# Patient Record
Sex: Male | Born: 1960
Health system: Southern US, Community
[De-identification: ages and names within clinical notes are randomized; demographics above are authoritative.]

## PROBLEM LIST (undated history)

## (undated) DIAGNOSIS — F419 Anxiety disorder, unspecified: Secondary | ICD-10-CM

## (undated) DIAGNOSIS — I714 Abdominal aortic aneurysm, without rupture, unspecified: Secondary | ICD-10-CM

## (undated) DIAGNOSIS — K635 Polyp of colon: Secondary | ICD-10-CM

## (undated) DIAGNOSIS — I2510 Atherosclerotic heart disease of native coronary artery without angina pectoris: Secondary | ICD-10-CM

## (undated) DIAGNOSIS — I10 Essential (primary) hypertension: Secondary | ICD-10-CM

## (undated) DIAGNOSIS — E785 Hyperlipidemia, unspecified: Secondary | ICD-10-CM

## (undated) DIAGNOSIS — L309 Dermatitis, unspecified: Secondary | ICD-10-CM

## (undated) DIAGNOSIS — J449 Chronic obstructive pulmonary disease, unspecified: Secondary | ICD-10-CM

## (undated) DIAGNOSIS — N529 Male erectile dysfunction, unspecified: Secondary | ICD-10-CM

## (undated) DIAGNOSIS — R911 Solitary pulmonary nodule: Secondary | ICD-10-CM

## (undated) HISTORY — DX: Essential (primary) hypertension: I10

## (undated) HISTORY — DX: Solitary pulmonary nodule: R91.1

## (undated) HISTORY — DX: Atherosclerotic heart disease of native coronary artery without angina pectoris: I25.10

## (undated) HISTORY — DX: Anxiety disorder, unspecified: F41.9

## (undated) HISTORY — PX: VASECTOMY: SHX75

## (undated) HISTORY — DX: Dermatitis, unspecified: L30.9

## (undated) HISTORY — DX: Hyperlipidemia, unspecified: E78.5

## (undated) HISTORY — DX: Abdominal aortic aneurysm, without rupture, unspecified: I71.40

## (undated) HISTORY — DX: Abdominal aortic aneurysm, without rupture: I71.4

## (undated) HISTORY — DX: Chronic obstructive pulmonary disease, unspecified: J44.9

## (undated) HISTORY — PX: TONSILLECTOMY AND ADENOIDECTOMY: SUR1326

## (undated) HISTORY — DX: Male erectile dysfunction, unspecified: N52.9

---
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
## 2006-06-16 ENCOUNTER — Ambulatory Visit: Payer: Self-pay | Admitting: Family Medicine

## 2007-06-02 ENCOUNTER — Ambulatory Visit: Payer: Self-pay | Admitting: Family Medicine

## 2007-06-02 DIAGNOSIS — I10 Essential (primary) hypertension: Secondary | ICD-10-CM | POA: Insufficient documentation

## 2007-06-02 DIAGNOSIS — F411 Generalized anxiety disorder: Secondary | ICD-10-CM | POA: Insufficient documentation

## 2007-06-08 ENCOUNTER — Encounter: Payer: Self-pay | Admitting: Family Medicine

## 2007-07-14 ENCOUNTER — Ambulatory Visit: Payer: Self-pay | Admitting: Family Medicine

## 2007-07-14 DIAGNOSIS — B351 Tinea unguium: Secondary | ICD-10-CM | POA: Insufficient documentation

## 2007-07-14 DIAGNOSIS — L600 Ingrowing nail: Secondary | ICD-10-CM | POA: Insufficient documentation

## 2007-08-22 ENCOUNTER — Ambulatory Visit: Payer: Self-pay | Admitting: Family Medicine

## 2007-08-22 LAB — CONVERTED CEMR LAB
ALT: 39 units/L (ref 0–53)
AST: 25 units/L (ref 0–37)

## 2007-10-14 ENCOUNTER — Ambulatory Visit: Payer: Self-pay | Admitting: Family Medicine

## 2007-10-14 LAB — CONVERTED CEMR LAB
ALT: 39 units/L (ref 0–53)
AST: 26 units/L (ref 0–37)

## 2007-10-18 ENCOUNTER — Ambulatory Visit: Payer: Self-pay | Admitting: Family Medicine

## 2008-01-19 ENCOUNTER — Ambulatory Visit: Payer: Self-pay | Admitting: Family Medicine

## 2008-07-31 ENCOUNTER — Ambulatory Visit: Payer: Self-pay | Admitting: Family Medicine

## 2008-07-31 LAB — CONVERTED CEMR LAB
ALT: 30 units/L (ref 0–53)
BUN: 13 mg/dL (ref 6–23)
CO2: 32 meq/L (ref 19–32)
Chloride: 105 meq/L (ref 96–112)
Cholesterol: 179 mg/dL (ref 0–200)
Creatinine, Ser: 0.9 mg/dL (ref 0.4–1.5)
Eosinophils Absolute: 0.2 10*3/uL (ref 0.0–0.7)
Eosinophils Relative: 1.9 % (ref 0.0–5.0)
Glucose, Bld: 90 mg/dL (ref 70–99)
HCT: 42.7 % (ref 39.0–52.0)
LDL Cholesterol: 110 mg/dL — ABNORMAL HIGH (ref 0–99)
Lymphs Abs: 3.1 10*3/uL (ref 0.7–4.0)
MCHC: 35.4 g/dL (ref 30.0–36.0)
MCV: 87.6 fL (ref 78.0–100.0)
Monocytes Absolute: 0.5 10*3/uL (ref 0.1–1.0)
PSA: 0.99 ng/mL (ref 0.10–4.00)
Platelets: 230 10*3/uL (ref 150.0–400.0)
Potassium: 5 meq/L (ref 3.5–5.1)
TSH: 0.94 microintl units/mL (ref 0.35–5.50)
Total Bilirubin: 1 mg/dL (ref 0.3–1.2)
Total Protein: 7.2 g/dL (ref 6.0–8.3)
Triglycerides: 163 mg/dL — ABNORMAL HIGH (ref 0.0–149.0)
WBC: 8.6 10*3/uL (ref 4.5–10.5)

## 2008-08-06 ENCOUNTER — Ambulatory Visit: Payer: Self-pay | Admitting: Family Medicine

## 2008-08-06 DIAGNOSIS — G4726 Circadian rhythm sleep disorder, shift work type: Secondary | ICD-10-CM | POA: Insufficient documentation

## 2008-08-06 DIAGNOSIS — L259 Unspecified contact dermatitis, unspecified cause: Secondary | ICD-10-CM | POA: Insufficient documentation

## 2008-09-18 ENCOUNTER — Ambulatory Visit: Payer: Self-pay | Admitting: Family Medicine

## 2009-01-09 DIAGNOSIS — G4700 Insomnia, unspecified: Secondary | ICD-10-CM | POA: Insufficient documentation

## 2009-11-12 ENCOUNTER — Encounter (INDEPENDENT_AMBULATORY_CARE_PROVIDER_SITE_OTHER): Payer: Self-pay | Admitting: *Deleted

## 2010-05-07 NOTE — Letter (Signed)
Summary: Nadara Eaton letter  [place] [facility]  [address] [place], [place] [address]   Phone: [phone]  Fax: [phone]       11/12/2009 MRN: [id number]  Paul Koch [address] [place], [place]  [address]  Dear Mr. HANSELL,  [facility] [place], and [facility] announce the retirement of Arta Silence, M.D., from full-time practice at the [facility] office effective October 03, 2009 and his plans of returning part-time.  It is important to Dr. Hetty Ely and to our practice that you understand that [facility] has seven physicians in our office for your health care needs.  We will continue to offer the same exceptional care that you have today.    Dr. Hetty Ely has spoken to many of you about his plans for retirement and returning part-time in the fall.   We will continue to work with you through the transition to schedule appointments for you in the office and meet the high standards that [place] is committed to.   Again, it is with great pleasure that we share the news that Dr. Hetty Ely will return to [facility] at [facility] in October of 2011 with a reduced schedule.    If you have any questions, or would like to request an appointment with one of our physicians, please call us at [phone] and press the option for Scheduling an appointment.  We take pleasure in providing you with excellent patient care and look forward to seeing you at your next office visit.  Our [facility] Physicians are:  Tillman Abide, M.D. Laurita Quint, M.D. Roxy Manns, M.D. Kerby Nora, M.D. Hannah Beat, M.D. Ruthe Mannan, M.D. We proudly welcomed Raechel Ache, M.D. and Eustaquio Boyden, M.D. to the practice in July/August 2011.  Sincerely,  [facility]

## 2012-04-06 DIAGNOSIS — K635 Polyp of colon: Secondary | ICD-10-CM

## 2012-04-06 HISTORY — DX: Polyp of colon: K63.5

## 2013-02-23 LAB — HM COLONOSCOPY

## 2016-01-14 ENCOUNTER — Other Ambulatory Visit: Payer: Self-pay | Admitting: Family Medicine

## 2016-01-14 DIAGNOSIS — R079 Chest pain, unspecified: Secondary | ICD-10-CM

## 2016-01-21 ENCOUNTER — Telehealth (HOSPITAL_COMMUNITY): Payer: Self-pay

## 2016-01-21 NOTE — Telephone Encounter (Signed)
Encounter complete. 

## 2016-01-22 ENCOUNTER — Telehealth (HOSPITAL_COMMUNITY): Payer: Self-pay

## 2016-01-22 NOTE — Telephone Encounter (Signed)
Encounter complete. 

## 2016-01-23 ENCOUNTER — Encounter (HOSPITAL_COMMUNITY): Payer: Self-pay | Admitting: *Deleted

## 2016-01-23 ENCOUNTER — Ambulatory Visit (HOSPITAL_COMMUNITY)
Admission: RE | Admit: 2016-01-23 | Discharge: 2016-01-23 | Disposition: A | Discharge status: Home / Self Care | Payer: BLUE CROSS/BLUE SHIELD | Source: Ambulatory Visit | Attending: Cardiovascular Disease | Admitting: Cardiovascular Disease

## 2016-01-23 DIAGNOSIS — R0609 Other forms of dyspnea: Secondary | ICD-10-CM | POA: Diagnosis not present

## 2016-01-23 DIAGNOSIS — R079 Chest pain, unspecified: Secondary | ICD-10-CM | POA: Diagnosis not present

## 2016-01-23 DIAGNOSIS — Z87891 Personal history of nicotine dependence: Secondary | ICD-10-CM | POA: Diagnosis not present

## 2016-01-23 DIAGNOSIS — R9439 Abnormal result of other cardiovascular function study: Secondary | ICD-10-CM | POA: Insufficient documentation

## 2016-01-23 DIAGNOSIS — Z8249 Family history of ischemic heart disease and other diseases of the circulatory system: Secondary | ICD-10-CM | POA: Insufficient documentation

## 2016-01-23 DIAGNOSIS — I10 Essential (primary) hypertension: Secondary | ICD-10-CM | POA: Diagnosis not present

## 2016-01-23 LAB — MYOCARDIAL PERFUSION IMAGING
CHL CUP NUCLEAR SDS: 8
CHL CUP NUCLEAR SRS: 6
CHL CUP NUCLEAR SSS: 14
CSEPEW: 9.6 METS
CSEPPHR: 139 {beats}/min
Exercise duration (min): 8 min
Exercise duration (sec): 0 s
LVDIAVOL: 74 mL (ref 62–150)
LVSYSVOL: 24 mL
MPHR: 163 {beats}/min
NUC STRESS TID: 0.74
Percent HR: 85 %
RPE: 17
Rest HR: 51 {beats}/min

## 2016-01-23 MED ORDER — TECHNETIUM TC 99M TETROFOSMIN IV KIT
30.0000 | PACK | Freq: Once | INTRAVENOUS | Status: AC | PRN
  Administered 2016-01-23: 30.8 via INTRAVENOUS
  Filled 2016-01-23: qty 30

## 2016-01-23 MED ORDER — TECHNETIUM TC 99M TETROFOSMIN IV KIT
10.2000 | PACK | Freq: Once | INTRAVENOUS | Status: AC | PRN
  Administered 2016-01-23: 10.2 via INTRAVENOUS
  Filled 2016-01-23: qty 11

## 2016-01-23 NOTE — Progress Notes (Signed)
Dr Tresa EndoKelly reviewed Myoview study and EKG's. Per Dr Tresa EndoKelly, pt instructed to take metoprolol when he gets home. Pt also instructed to start taking one baby aspirin daily. Pt verbalized understanding. Ok to d/c pt home.

## 2016-01-28 ENCOUNTER — Ambulatory Visit
Admission: RE | Admit: 2016-01-28 | Discharge: 2016-01-28 | Disposition: A | Discharge status: Home / Self Care | Payer: BLUE CROSS/BLUE SHIELD | Source: Ambulatory Visit | Attending: Internal Medicine | Admitting: Internal Medicine

## 2016-01-28 ENCOUNTER — Encounter (INDEPENDENT_AMBULATORY_CARE_PROVIDER_SITE_OTHER): Payer: Self-pay

## 2016-01-28 ENCOUNTER — Encounter: Payer: Self-pay | Admitting: Internal Medicine

## 2016-01-28 ENCOUNTER — Other Ambulatory Visit
Admission: RE | Admit: 2016-01-28 | Discharge: 2016-01-28 | Disposition: A | Discharge status: Home / Self Care | Payer: BLUE CROSS/BLUE SHIELD | Source: Ambulatory Visit | Attending: Internal Medicine | Admitting: Internal Medicine

## 2016-01-28 ENCOUNTER — Ambulatory Visit (INDEPENDENT_AMBULATORY_CARE_PROVIDER_SITE_OTHER): Payer: BLUE CROSS/BLUE SHIELD | Admitting: Internal Medicine

## 2016-01-28 VITALS — BP 126/82 | HR 54 | Ht 69.0 in | Wt 165.8 lb

## 2016-01-28 DIAGNOSIS — I10 Essential (primary) hypertension: Secondary | ICD-10-CM

## 2016-01-28 DIAGNOSIS — Z87891 Personal history of nicotine dependence: Secondary | ICD-10-CM | POA: Diagnosis not present

## 2016-01-28 DIAGNOSIS — Z825 Family history of asthma and other chronic lower respiratory diseases: Secondary | ICD-10-CM | POA: Diagnosis not present

## 2016-01-28 DIAGNOSIS — Z01812 Encounter for preprocedural laboratory examination: Secondary | ICD-10-CM

## 2016-01-28 DIAGNOSIS — F419 Anxiety disorder, unspecified: Secondary | ICD-10-CM | POA: Diagnosis not present

## 2016-01-28 DIAGNOSIS — R918 Other nonspecific abnormal finding of lung field: Secondary | ICD-10-CM | POA: Insufficient documentation

## 2016-01-28 DIAGNOSIS — Z01818 Encounter for other preprocedural examination: Secondary | ICD-10-CM | POA: Insufficient documentation

## 2016-01-28 DIAGNOSIS — Z8249 Family history of ischemic heart disease and other diseases of the circulatory system: Secondary | ICD-10-CM | POA: Diagnosis not present

## 2016-01-28 DIAGNOSIS — R9439 Abnormal result of other cardiovascular function study: Secondary | ICD-10-CM | POA: Diagnosis not present

## 2016-01-28 DIAGNOSIS — Z823 Family history of stroke: Secondary | ICD-10-CM | POA: Diagnosis not present

## 2016-01-28 DIAGNOSIS — R079 Chest pain, unspecified: Secondary | ICD-10-CM

## 2016-01-28 DIAGNOSIS — Z7982 Long term (current) use of aspirin: Secondary | ICD-10-CM | POA: Diagnosis not present

## 2016-01-28 DIAGNOSIS — Z79899 Other long term (current) drug therapy: Secondary | ICD-10-CM | POA: Diagnosis not present

## 2016-01-28 LAB — CBC
HEMATOCRIT: 45.8 % (ref 40.0–52.0)
Hemoglobin: 15.8 g/dL (ref 13.0–18.0)
MCH: 29.5 pg (ref 26.0–34.0)
MCHC: 34.4 g/dL (ref 32.0–36.0)
MCV: 85.6 fL (ref 80.0–100.0)
PLATELETS: 204 10*3/uL (ref 150–440)
RBC: 5.35 MIL/uL (ref 4.40–5.90)
RDW: 13.6 % (ref 11.5–14.5)
WBC: 8.8 10*3/uL (ref 3.8–10.6)

## 2016-01-28 LAB — BASIC METABOLIC PANEL
ANION GAP: 7 (ref 5–15)
BUN: 18 mg/dL (ref 6–20)
CALCIUM: 9.2 mg/dL (ref 8.9–10.3)
CO2: 30 mmol/L (ref 22–32)
CREATININE: 1.13 mg/dL (ref 0.61–1.24)
Chloride: 100 mmol/L — ABNORMAL LOW (ref 101–111)
Glucose, Bld: 104 mg/dL — ABNORMAL HIGH (ref 65–99)
Potassium: 4.4 mmol/L (ref 3.5–5.1)
Sodium: 137 mmol/L (ref 135–145)

## 2016-01-28 LAB — PROTIME-INR
INR: 1.02
Prothrombin Time: 13.4 seconds (ref 11.4–15.2)

## 2016-01-28 MED ORDER — NITROGLYCERIN 0.4 MG SL SUBL: 0.4000 mg | SUBLINGUAL_TABLET | SUBLINGUAL | 3 refills | Status: DC | PRN

## 2016-01-28 MED ORDER — ISOSORBIDE MONONITRATE ER 30 MG PO TB24: 30.0000 mg | ORAL_TABLET | Freq: Every day | ORAL | 3 refills | Status: DC

## 2016-01-28 NOTE — Progress Notes (Signed)
New Outpatient Visit Date: 01/28/2016  Referring Provider: Gilles Chiquito, MD [facility]  Chief Complaint: Chest pain and abnormal stress test  HPI:  Mr. Grieco is a 55 y.o. year-old male with history of hypertension and anxiety, who has been referred by Dr. Ellin Goodie for evaluation of chest pain. The patient reports that he began noticing a squeezing/tightness sensation in his chest with mowing about 1 month ago. This typically resolves within a few minutes. However, it is now started occurring with even mild activity such as walking or climbing stairs. He is noted some associated numbness involving the left shoulder as well as exertional dyspnea. The patient's maximal intensity has been 6/10. He noted one episode that occurred at night while lying in bed that lasted about 5-10 minutes. At the time, his blood pressure was 211/120 and remained elevated into the next day. This prompted him to speak with his PCP, who adjusted his blood pressure medications and referred for myocardial perfusion stress test. The stress test revealed a partially reversible inferior/inferoseptal defect.  The patient has continued to have intermittent chest pain, most recently a few days ago [place]. He is concerned that a lot of stress at work may also be contributing to his discomfort. He has not undergone previous cardiovascular evaluation and denies a history of known cardiac disease with the exception of a right bundle branch block (not evident on today's EKG). He is currently on metoprolol, ramipril, and amlodipine, which he is tolerating well. He has not had any significant bleeding.  --------------------------------------------------------------------------------------------------  Cardiovascular History & Procedures: Cardiovascular Problems:  Progressive angina with abnormal stress test  Risk Factors:  Age, male gender, and  hypertension  Cath/PCI:  None  CV Surgery:  None  EP Procedures and Devices:  None  Non-Invasive Evaluation(s):  Exercise myocardial perfusion stress test (01/23/16): I have personally reviewed the images.  Moderate size, moderate in severity partially reversible basal inferoseptal, mid inferoseptal, mid inferior, and apical inferior defect consistent with ischemia and possible scar. Treadmill demonstrates 2 mm ST depression in the inferior leads. LVEF greater at 65%.  Recent CV Pertinent Labs: Lab Results  Component Value Date   CHOL 179 07/31/2008   HDL 36.10 (L) 07/31/2008   LDLCALC 110 (H) 07/31/2008   TRIG 163.0 (H) 07/31/2008   CHOLHDL 5 07/31/2008   K 5.0 07/31/2008   BUN 13 07/31/2008   CREATININE 0.9 07/31/2008    --------------------------------------------------------------------------------------------------  Past Medical History:  Diagnosis Date  . Anxiety disorder   . Eczema   . Hypertension     Past Surgical History:  Procedure Laterality Date  . TONSILLECTOMY AND ADENOIDECTOMY    . VASECTOMY      Outpatient Encounter Prescriptions as of 01/28/2016  Medication Sig  . amLODipine (NORVASC) 5 MG tablet Take 5 mg by mouth 2 (two) times daily.  Marland Kitchen aspirin 81 MG chewable tablet Chew 81 mg by mouth daily.  . clonazePAM (KLONOPIN) 1 MG tablet Take 1 mg by mouth 2 (two) times daily.  Marland Kitchen FLUoxetine (PROZAC) 20 MG capsule TAKE 1 CAPSULE BY MOUTH IN THE MORNING ONCE DAILY  . loratadine (CLARITIN) 10 MG tablet Take 10 mg by mouth daily.  . metoprolol (LOPRESSOR) 100 MG tablet Take 100 mg by mouth 2 (two) times daily.  . ramipril (ALTACE) 10 MG capsule Take 10 mg by mouth 2 (two) times daily.   Marland Kitchen zolpidem (AMBIEN) 10 MG tablet Take 10 mg by mouth at bedtime as  needed.  . isosorbide mononitrate (IMDUR) 30 MG 24 hr tablet Take 1 tablet (30 mg total) by mouth daily.  . nitroGLYCERIN (NITROSTAT) 0.4 MG SL tablet Place 1 tablet (0.4 mg total) under the tongue every  5 (five) minutes as needed for chest pain.   No facility-administered encounter medications on file as of 01/28/2016.     Allergies: Clindamycin hcl  Social History   Social History  . Marital status: Married    Spouse name: N/A  . Number of children: N/A  . Years of education: N/A   Occupational History  . Not on file.   Social History Main Topics  . Smoking status: Former Smoker    Packs/day: 1.00    Years: 20.00    Types: Cigarettes    Quit date: 2004  . Smokeless tobacco: Never Used  . Alcohol use No     Comment: social (5 drinks/year)  . Drug use: No  . Sexual activity: Not on file   Other Topics Concern  . Not on file   Social History Narrative  . No narrative on file    Family History  Problem Relation Age of Onset  . Brain cancer Mother   . Lung cancer Mother   . Arrhythmia Mother   . Hypertension Mother   . Hyperlipidemia Mother   . Bladder Cancer Mother   . Stroke Mother   . Emphysema Father     Review of Systems: A 12-system review of systems was performed and was negative except as noted in the HPI.  --------------------------------------------------------------------------------------------------  Physical Exam: BP 126/82 (BP Location: Right Arm, Patient Position: Sitting, Cuff Size: Normal)   Pulse (!) 54   Ht 5\' 9"  (1.753 m)   Wt 165 lb 12 oz (75.2 kg)   BMI 24.48 kg/m   General:  Well-developed, well-nourished man seated comfortably in the exam room. He is accompanied by his wife. HEENT: No conjunctival pallor or scleral icterus.  Moist mucous membranes.  OP clear. Neck: Supple without lymphadenopathy, thyromegaly, JVD, or HJR.  No carotid bruit. Lungs: Normal work of breathing.  Clear to auscultation bilaterally without wheezes or crackles. Heart: Regular rate and rhythm without murmurs, rubs, or gallops.  Non-displaced PMI. Abd: Bowel sounds present.  Soft, NT/ND without hepatosplenomegaly Ext: No lower extremity edema.  Radial and  posterior tibial pulses are 2+ bilaterally. Dorsalis pedis pulses are 1+ bilaterally. Skin: warm and dry without rash Neuro: CNIII-XII intact.  Strength and fine-touch sensation intact in upper and lower extremities bilaterally. Psych: Normal mood and affect.  EKG:  I have personally reviewed the tracing.  Normal sinus rhythm with leftward axis. No significant abnormalities.  Lab Results  Component Value Date   WBC 8.6 07/31/2008   HGB 15.1 07/31/2008   HCT 42.7 07/31/2008   MCV 87.6 07/31/2008   PLT 230.0 07/31/2008    Lab Results  Component Value Date   NA 143 07/31/2008   K 5.0 07/31/2008   CL 105 07/31/2008   CO2 32 07/31/2008   BUN 13 07/31/2008   CREATININE 0.9 07/31/2008   GLUCOSE 90 07/31/2008   ALT 30 07/31/2008    Lab Results  Component Value Date   CHOL 179 07/31/2008   HDL 36.10 (L) 07/31/2008   LDLCALC 110 (H) 07/31/2008   TRIG 163.0 (H) 07/31/2008   CHOLHDL 5 07/31/2008    --------------------------------------------------------------------------------------------------  ASSESSMENT AND PLAN: 1. Chest pain and abnormal stress test The patient's progressive angina over the last month in the setting of  an abnormal stress test are very concerning for significant coronary insufficiency. He is chest pain-free today. We have agreed to start isosorbide mononitrate 30 mg daily. I have also prescribed as needed sublingual nitroglycerin should his chest pain recur. I advised him to limit his activity pending cardiac catheterization. We will attempt to schedule him for left heart catheterization with possible PCI at [facility] tomorrow or Thursday. I have reviewed the risks, indications, and alternatives to cardiac catheterization, possible angioplasty, and stenting with the patient. Risks include but are not limited to bleeding, infection, vascular injury, stroke, myocardial infection, arrhythmia, kidney injury, radiation-related injury in the case of prolonged fluoroscopy  use, emergency cardiac surgery, and death. The patient understands the risks of serious complication is 1-2 in 1000 with diagnostic cardiac cath and 1-2% or less with angioplasty/stenting. I advised Mr. Dorris CarnesRorer to call [phone] if he develops chest pain at rest that does not resolve promptly with sublingual nitroglycerin. He will likely need addition of a high-intensity statin. We will defer this until his catheterization has been completed later this week.  2.  Hypertension: Blood pressure is reasonably well controlled today. Will not make any changes to his current medication with the exception of the aforementioned isosorbide mononitrate.  Follow-up: To be determined based on cardiac catheterization results.  Yvonne Kendallhristopher Azya Barbero, MD 01/28/2016 4:10 PM

## 2016-01-28 NOTE — Patient Instructions (Addendum)
Medication Instructions:  Your physician has recommended you make the following change in your medication:  START taking imdur 30mg  once daily START taking nitroglycerin as needed. You may take one every 5 minutes up to 3 times. If chest pain still unresolved, proceed to the ED   Labwork: BMET, CBC, PT/INR  Testing/Procedures: Your physician has requested that you have a cardiac catheterization. Cardiac catheterization is used to diagnose and/or treat various heart conditions. Doctors may recommend this procedure for a number of different reasons. The most common reason is to evaluate chest pain. Chest pain can be a symptom of coronary artery disease (CAD), and cardiac catheterization can show whether plaque is narrowing or blocking your heart's arteries. This procedure is also used to evaluate the valves, as well as measure the blood flow and oxygen levels in different parts of your heart. For further information please visit [email or web address] Please follow instruction sheet, as given.  [facility] Cardiac Cath Instructions   You are scheduled for a Cardiac Cath on: Thursday, October 26  Please arrive at 5:30am on the day of your procedure  Please expect a call from our [facility] to pre-register you  Do not eat/drink anything after midnight  Someone will need to drive you home  It is recommended someone be with you for the first 24 hours after your procedure  Wear clothes that are easy to get on/off and wear slip on shoes if possible   Medications bring a current list of all medications with you  _xx__ You may take all of your medications the morning of your procedure with enough water to swallow safely     Day of your procedure: [facility], Short Stay, Entrance A [address] [place] [phone]  The usual length of stay after your procedure is about 2 to 3 hours.  This can vary.  Pack an overnight bag in case you stay overnight.     If you have any questions, please call our office at [phone].   Follow-Up: Your physician recommends that you schedule a follow-up appointment after your catheterization.    Any Other Special Instructions Will Be Listed Below (If Applicable).     If you need a refill on your cardiac medications before your next appointment, please call your pharmacy.   Angiogram An angiogram, also called angiography, is a procedure used to look at the blood vessels. In this procedure, dye is injected through a long, thin tube (catheter) into an artery. X-rays are then taken. The X-rays will show if there is a blockage or problem in a blood vessel.  LET [facility] CARE PROVIDER KNOW ABOUT:  Any allergies you have, including allergies to shellfish or contrast dye.   All medicines you are taking, including vitamins, herbs, eye drops, creams, and over-the-counter medicines.   Previous problems you or members of your family have had with the use of anesthetics.   Any blood disorders you have.   Previous surgeries you have had.  Any previous kidney problems or failure you have had.  Medical conditions you have.   Possibility of pregnancy, if this applies. RISKS AND COMPLICATIONS Generally, an angiogram is a safe procedure. However, as with any procedure, problems can occur. Possible problems include:  Injury to the blood vessels, including rupture or bleeding.  Infection or bruising at the catheter site.  Allergic reaction to the dye or contrast used.  Kidney damage from the dye or contrast used.  Blood clots that can lead  to a stroke or heart attack. BEFORE THE PROCEDURE  Do not eat or drink after midnight on the night before the procedure, or as directed by your health care provider.   Ask your health care provider if you may drink enough water to take any needed medicines the morning of the procedure.  PROCEDURE  You may be given a medicine to help you relax  (sedative) before and during the procedure. This medicine is given through an IV access tube that is inserted into one of your veins.   The area where the catheter will be inserted will be washed and shaved. This is usually done in the groin but may be done in the fold of your arm (near your elbow) or in the wrist.  A medicine will be given to numb the area where the catheter will be inserted (local anesthetic).  The catheter will be inserted with a guide wire into an artery. The catheter is guided by using a type of X-ray (fluoroscopy) to the blood vessel being examined.   Dye is then injected into the catheter, and X-rays are taken. The dye helps to show where any narrowing or blockages are located.  AFTER THE PROCEDURE   If the procedure is done through the leg, you will be kept in bed lying flat for several hours. You will be instructed to not bend or cross your legs.  The insertion site will be checked frequently.  The pulse in your feet or wrist will be checked frequently.  Additional blood tests, X-rays, and electrocardiography may be done.   You may need to stay in the hospital overnight for observation.    This information is not intended to replace advice given to you by your health care provider. Make sure you discuss any questions you have with your health care provider.   Document Released: 12/31/2004 Document Revised: 04/13/2014 Document Reviewed: 08/24/2012 Elsevier Interactive Patient Education [employer]2016 [employer] Inc.

## 2016-01-29 ENCOUNTER — Other Ambulatory Visit: Payer: Self-pay

## 2016-01-29 ENCOUNTER — Telehealth: Payer: Self-pay | Admitting: Internal Medicine

## 2016-01-29 DIAGNOSIS — R9389 Abnormal findings on diagnostic imaging of other specified body structures: Secondary | ICD-10-CM

## 2016-01-29 NOTE — Telephone Encounter (Signed)
We will plan to repeat CXR with nipple markers and lordotic positioning, as recommended by radiology.  Will proceed with [facility] tomorrow first due to significant angina and abnormal stress test.

## 2016-01-29 NOTE — Telephone Encounter (Signed)
Left detailed message on pt home VM regarding cardiac cath instructions. Provided CB number if questions.

## 2016-01-29 NOTE — Telephone Encounter (Addendum)
Received call from [facility] @ [place] radiology who reports CXR results: "IMPRESSION: No acute findings. Suspected incidental nodular density projecting over the left lung base, not likely to be clinically significant. Repeat frontal examination with nipple markers and lordotic positioning may be helpful for more definitive characterization."  Notified Dr. Okey DupreEnd via phone and forward to MD to make aware.

## 2016-01-30 ENCOUNTER — Ambulatory Visit (HOSPITAL_COMMUNITY)
Admission: RE | Admit: 2016-01-30 | Discharge: 2016-01-30 | Disposition: A | Discharge status: Home / Self Care | Payer: BLUE CROSS/BLUE SHIELD | Source: Ambulatory Visit | Attending: Interventional Cardiology | Admitting: Interventional Cardiology

## 2016-01-30 ENCOUNTER — Encounter (HOSPITAL_COMMUNITY)
Admission: RE | Disposition: A | Discharge status: Home / Self Care | Payer: Self-pay | Source: Ambulatory Visit | Attending: Interventional Cardiology

## 2016-01-30 DIAGNOSIS — Z955 Presence of coronary angioplasty implant and graft: Secondary | ICD-10-CM

## 2016-01-30 DIAGNOSIS — I2510 Atherosclerotic heart disease of native coronary artery without angina pectoris: Secondary | ICD-10-CM | POA: Diagnosis not present

## 2016-01-30 DIAGNOSIS — I10 Essential (primary) hypertension: Secondary | ICD-10-CM | POA: Diagnosis not present

## 2016-01-30 DIAGNOSIS — I25119 Atherosclerotic heart disease of native coronary artery with unspecified angina pectoris: Secondary | ICD-10-CM | POA: Diagnosis not present

## 2016-01-30 DIAGNOSIS — F419 Anxiety disorder, unspecified: Secondary | ICD-10-CM | POA: Insufficient documentation

## 2016-01-30 DIAGNOSIS — Z7982 Long term (current) use of aspirin: Secondary | ICD-10-CM | POA: Insufficient documentation

## 2016-01-30 DIAGNOSIS — Z808 Family history of malignant neoplasm of other organs or systems: Secondary | ICD-10-CM | POA: Insufficient documentation

## 2016-01-30 DIAGNOSIS — R9439 Abnormal result of other cardiovascular function study: Secondary | ICD-10-CM | POA: Diagnosis present

## 2016-01-30 DIAGNOSIS — Z8052 Family history of malignant neoplasm of bladder: Secondary | ICD-10-CM | POA: Insufficient documentation

## 2016-01-30 DIAGNOSIS — Z87891 Personal history of nicotine dependence: Secondary | ICD-10-CM | POA: Insufficient documentation

## 2016-01-30 DIAGNOSIS — Z8249 Family history of ischemic heart disease and other diseases of the circulatory system: Secondary | ICD-10-CM | POA: Diagnosis not present

## 2016-01-30 DIAGNOSIS — I4510 Unspecified right bundle-branch block: Secondary | ICD-10-CM | POA: Insufficient documentation

## 2016-01-30 DIAGNOSIS — Z823 Family history of stroke: Secondary | ICD-10-CM | POA: Insufficient documentation

## 2016-01-30 DIAGNOSIS — Z801 Family history of malignant neoplasm of trachea, bronchus and lung: Secondary | ICD-10-CM | POA: Diagnosis not present

## 2016-01-30 DIAGNOSIS — R079 Chest pain, unspecified: Secondary | ICD-10-CM

## 2016-01-30 HISTORY — PX: CARDIAC CATHETERIZATION: SHX172

## 2016-01-30 LAB — POCT ACTIVATED CLOTTING TIME: Activated Clotting Time: 466 seconds

## 2016-01-30 SURGERY — LEFT HEART CATH AND CORONARY ANGIOGRAPHY
Anesthesia: LOCAL

## 2016-01-30 MED ORDER — SODIUM CHLORIDE 0.9 % IV SOLN: 250.0000 mL | INTRAVENOUS | Status: DC | PRN

## 2016-01-30 MED ORDER — CLOPIDOGREL BISULFATE 75 MG PO TABS: 75.0000 mg | ORAL_TABLET | Freq: Every day | ORAL | 12 refills | Status: DC

## 2016-01-30 MED ORDER — IOPAMIDOL (ISOVUE-370) INJECTION 76%
INTRAVENOUS | Status: AC
Start: 2016-01-30 — End: 2016-01-30
  Filled 2016-01-30: qty 100

## 2016-01-30 MED ORDER — CLOPIDOGREL BISULFATE 300 MG PO TABS
ORAL_TABLET | ORAL | Status: AC
  Filled 2016-01-30: qty 2

## 2016-01-30 MED ORDER — HEPARIN (PORCINE) IN NACL 2-0.9 UNIT/ML-% IJ SOLN
INTRAMUSCULAR | Status: AC
  Filled 2016-01-30: qty 1000

## 2016-01-30 MED ORDER — VERAPAMIL HCL 2.5 MG/ML IV SOLN
INTRAVENOUS | Status: AC
  Filled 2016-01-30: qty 2

## 2016-01-30 MED ORDER — LIDOCAINE HCL (PF) 1 % IJ SOLN
INTRAMUSCULAR | Status: DC | PRN
  Administered 2016-01-30: 2 mL via INTRADERMAL

## 2016-01-30 MED ORDER — FENTANYL CITRATE (PF) 100 MCG/2ML IJ SOLN
INTRAMUSCULAR | Status: AC
  Filled 2016-01-30: qty 2

## 2016-01-30 MED ORDER — SODIUM CHLORIDE 0.9 % WEIGHT BASED INFUSION: 1.0000 mL/kg/h | INTRAVENOUS | Status: DC

## 2016-01-30 MED ORDER — SODIUM CHLORIDE 0.9% FLUSH: 3.0000 mL | Freq: Two times a day (BID) | INTRAVENOUS | Status: DC

## 2016-01-30 MED ORDER — CLOPIDOGREL BISULFATE 75 MG PO TABS: 75.0000 mg | ORAL_TABLET | Freq: Every day | ORAL | Status: DC

## 2016-01-30 MED ORDER — SODIUM CHLORIDE 0.9 % WEIGHT BASED INFUSION
3.0000 mL/kg/h | INTRAVENOUS | Status: DC
  Administered 2016-01-30: 3 mL/kg/h via INTRAVENOUS

## 2016-01-30 MED ORDER — ASPIRIN 81 MG PO CHEW: 81.0000 mg | CHEWABLE_TABLET | ORAL | Status: DC

## 2016-01-30 MED ORDER — CLOPIDOGREL BISULFATE 75 MG PO TABS: 75.0000 mg | ORAL_TABLET | Freq: Every day | ORAL | 0 refills | Status: DC

## 2016-01-30 MED ORDER — SODIUM CHLORIDE 0.9% FLUSH: 3.0000 mL | INTRAVENOUS | Status: DC | PRN

## 2016-01-30 MED ORDER — LIDOCAINE HCL (PF) 1 % IJ SOLN
INTRAMUSCULAR | Status: AC
  Filled 2016-01-30: qty 30

## 2016-01-30 MED ORDER — BIVALIRUDIN BOLUS VIA INFUSION - CUPID
INTRAVENOUS | Status: DC | PRN
  Administered 2016-01-30: 56.1 mg via INTRAVENOUS

## 2016-01-30 MED ORDER — VERAPAMIL HCL 2.5 MG/ML IV SOLN
INTRAVENOUS | Status: DC | PRN
  Administered 2016-01-30: 10 mL via INTRA_ARTERIAL

## 2016-01-30 MED ORDER — ANGIOPLASTY BOOK
Freq: Once | Status: DC
  Filled 2016-01-30 (×2): qty 1

## 2016-01-30 MED ORDER — NITROGLYCERIN 1 MG/10 ML FOR IR/CATH LAB
INTRA_ARTERIAL | Status: AC
  Filled 2016-01-30: qty 10

## 2016-01-30 MED ORDER — MIDAZOLAM HCL 2 MG/2ML IJ SOLN
INTRAMUSCULAR | Status: AC
  Filled 2016-01-30: qty 2

## 2016-01-30 MED ORDER — SODIUM CHLORIDE 0.9 % IV SOLN
INTRAVENOUS | Status: DC | PRN
  Administered 2016-01-30: 1.75 mg/kg/h via INTRAVENOUS

## 2016-01-30 MED ORDER — ASPIRIN EC 81 MG PO TBEC: 81.0000 mg | DELAYED_RELEASE_TABLET | Freq: Every day | ORAL | 0 refills | Status: DC

## 2016-01-30 MED ORDER — NITROGLYCERIN 1 MG/10 ML FOR IR/CATH LAB
INTRA_ARTERIAL | Status: DC | PRN
  Administered 2016-01-30 (×2): 200 ug via INTRACORONARY
  Administered 2016-01-30: 200 ug via INTRA_ARTERIAL

## 2016-01-30 MED ORDER — BIVALIRUDIN 250 MG IV SOLR
INTRAVENOUS | Status: AC
  Filled 2016-01-30: qty 250

## 2016-01-30 MED ORDER — FENTANYL CITRATE (PF) 100 MCG/2ML IJ SOLN
INTRAMUSCULAR | Status: DC | PRN
  Administered 2016-01-30 (×4): 25 ug via INTRAVENOUS

## 2016-01-30 MED ORDER — HEPARIN SODIUM (PORCINE) 1000 UNIT/ML IJ SOLN
INTRAMUSCULAR | Status: DC | PRN
  Administered 2016-01-30: 4000 [IU] via INTRAVENOUS

## 2016-01-30 MED ORDER — SODIUM CHLORIDE 0.9 % WEIGHT BASED INFUSION: 1.0000 mL/kg/h | INTRAVENOUS | Status: AC

## 2016-01-30 MED ORDER — HEPARIN SODIUM (PORCINE) 1000 UNIT/ML IJ SOLN
INTRAMUSCULAR | Status: AC
  Filled 2016-01-30: qty 1

## 2016-01-30 MED ORDER — MIDAZOLAM HCL 2 MG/2ML IJ SOLN
INTRAMUSCULAR | Status: DC | PRN
  Administered 2016-01-30 (×2): 1 mg via INTRAVENOUS
  Administered 2016-01-30: 2 mg via INTRAVENOUS

## 2016-01-30 MED ORDER — CLOPIDOGREL BISULFATE 300 MG PO TABS
ORAL_TABLET | ORAL | Status: DC | PRN
  Administered 2016-01-30: 600 mg via ORAL

## 2016-01-30 MED ORDER — IOPAMIDOL (ISOVUE-370) INJECTION 76%
INTRAVENOUS | Status: DC | PRN
  Administered 2016-01-30: 110 mL via INTRA_ARTERIAL

## 2016-01-30 MED ORDER — ASPIRIN 81 MG PO CHEW: 81.0000 mg | CHEWABLE_TABLET | Freq: Every day | ORAL | Status: DC

## 2016-01-30 MED FILL — ASPIR-LOW EC 81 MG TABLET: 81 | 30 days supply | Qty: 30 | Fill #0

## 2016-01-30 MED FILL — CLOPIDOGREL 75 MG TABLET: 75 | 30 days supply | Qty: 30 | Fill #0

## 2016-01-30 SURGICAL SUPPLY — 17 items
BALLN EMERGE MR 2.5X20 (BALLOONS) ×2
BALLN ~~LOC~~ EMERGE MR 3.75X30 (BALLOONS) ×2
BALLOON EMERGE MR 2.5X20 (BALLOONS) ×1 IMPLANT
BALLOON ~~LOC~~ EMERGE MR 3.75X30 (BALLOONS) ×1 IMPLANT
CATH INFINITI 5 FR JL3.5 (CATHETERS) ×2 IMPLANT
CATH INFINITI JR4 5F (CATHETERS) ×2 IMPLANT
DEVICE RAD COMP TR BAND LRG (VASCULAR PRODUCTS) ×2 IMPLANT
GLIDESHEATH SLEND SS 6F .021 (SHEATH) ×2 IMPLANT
GUIDE CATH RUNWAY 6FR FR4 (CATHETERS) ×2 IMPLANT
KIT HEART LEFT (KITS) ×2 IMPLANT
PACK CARDIAC CATHETERIZATION (CUSTOM PROCEDURE TRAY) ×2 IMPLANT
STENT PROMUS PREM MR 3.5X38 (Permanent Stent) ×2 IMPLANT
TRANSDUCER W/STOPCOCK (MISCELLANEOUS) ×2 IMPLANT
TUBING CIL FLEX 10 FLL-RA (TUBING) ×2 IMPLANT
VALVE GUARDIAN II ~~LOC~~ HEMO (MISCELLANEOUS) ×2 IMPLANT
WIRE HI TORQ VERSACORE-J 145CM (WIRE) ×2 IMPLANT
WIRE MARVEL STR TIP 190CM (WIRE) ×2 IMPLANT

## 2016-01-30 NOTE — H&P (View-Only) (Signed)
New Outpatient Visit Date: 01/28/2016  Referring Provider: Gilles Chiquito, MD [facility]  Chief Complaint: Chest pain and abnormal stress test  HPI:  Paul Koch is a 55 y.o. year-old male with history of hypertension and anxiety, who has been referred by Dr. Ellin Goodie for evaluation of chest pain. The patient reports that he began noticing a squeezing/tightness sensation in his chest with mowing about 1 month ago. This typically resolves within a few minutes. However, it is now started occurring with even mild activity such as walking or climbing stairs. He is noted some associated numbness involving the left shoulder as well as exertional dyspnea. The patient's maximal intensity has been 6/10. He noted one episode that occurred at night while lying in bed that lasted about 5-10 minutes. At the time, his blood pressure was 211/120 and remained elevated into the next day. This prompted him to speak with his PCP, who adjusted his blood pressure medications and referred for myocardial perfusion stress test. The stress test revealed a partially reversible inferior/inferoseptal defect.  The patient has continued to have intermittent chest pain, most recently a few days ago while vacationing in the mountains. He is concerned that a lot of stress at work may also be contributing to his discomfort. He has not undergone previous cardiovascular evaluation and denies a history of known cardiac disease with the exception of a right bundle branch block (not evident on today's EKG). He is currently on metoprolol, ramipril, and amlodipine, which he is tolerating well. He has not had any significant bleeding.  --------------------------------------------------------------------------------------------------  Cardiovascular History & Procedures: Cardiovascular Problems:  Progressive angina with abnormal stress test  Risk Factors:  Age, male gender, and  hypertension  Cath/PCI:  None  CV Surgery:  None  EP Procedures and Devices:  None  Non-Invasive Evaluation(s):  Exercise myocardial perfusion stress test (01/23/16): I have personally reviewed the images.  Moderate size, moderate in severity partially reversible basal inferoseptal, mid inferoseptal, mid inferior, and apical inferior defect consistent with ischemia and possible scar. Treadmill demonstrates 2 mm ST depression in the inferior leads. LVEF greater at 65%.  Recent CV Pertinent Labs: Lab Results  Component Value Date   CHOL 179 07/31/2008   HDL 36.10 (L) 07/31/2008   LDLCALC 110 (H) 07/31/2008   TRIG 163.0 (H) 07/31/2008   CHOLHDL 5 07/31/2008   K 5.0 07/31/2008   BUN 13 07/31/2008   CREATININE 0.9 07/31/2008    --------------------------------------------------------------------------------------------------  Past Medical History:  Diagnosis Date  . Anxiety disorder   . Eczema   . Hypertension     Past Surgical History:  Procedure Laterality Date  . TONSILLECTOMY AND ADENOIDECTOMY    . VASECTOMY      Outpatient Encounter Prescriptions as of 01/28/2016  Medication Sig  . amLODipine (NORVASC) 5 MG tablet Take 5 mg by mouth 2 (two) times daily.  Marland Kitchen aspirin 81 MG chewable tablet Chew 81 mg by mouth daily.  . clonazePAM (KLONOPIN) 1 MG tablet Take 1 mg by mouth 2 (two) times daily.  Marland Kitchen FLUoxetine (PROZAC) 20 MG capsule TAKE 1 CAPSULE BY MOUTH IN THE MORNING ONCE DAILY  . loratadine (CLARITIN) 10 MG tablet Take 10 mg by mouth daily.  . metoprolol (LOPRESSOR) 100 MG tablet Take 100 mg by mouth 2 (two) times daily.  . ramipril (ALTACE) 10 MG capsule Take 10 mg by mouth 2 (two) times daily.   Marland Kitchen zolpidem (AMBIEN) 10 MG tablet Take 10 mg by mouth at bedtime as  needed.  . isosorbide mononitrate (IMDUR) 30 MG 24 hr tablet Take 1 tablet (30 mg total) by mouth daily.  . nitroGLYCERIN (NITROSTAT) 0.4 MG SL tablet Place 1 tablet (0.4 mg total) under the tongue every  5 (five) minutes as needed for chest pain.   No facility-administered encounter medications on file as of 01/28/2016.     Allergies: Clindamycin hcl  Social History   Social History  . Marital status: Married    Spouse name: N/A  . Number of children: N/A  . Years of education: N/A   Occupational History  . Not on file.   Social History Main Topics  . Smoking status: Former Smoker    Packs/day: 1.00    Years: 20.00    Types: Cigarettes    Quit date: 2004  . Smokeless tobacco: Never Used  . Alcohol use No     Comment: social (5 drinks/year)  . Drug use: No  . Sexual activity: Not on file   Other Topics Concern  . Not on file   Social History Narrative  . No narrative on file    Family History  Problem Relation Age of Onset  . Brain cancer Mother   . Lung cancer Mother   . Arrhythmia Mother   . Hypertension Mother   . Hyperlipidemia Mother   . Bladder Cancer Mother   . Stroke Mother   . Emphysema Father     Review of Systems: A 12-system review of systems was performed and was negative except as noted in the HPI.  --------------------------------------------------------------------------------------------------  Physical Exam: BP 126/82 (BP Location: Right Arm, Patient Position: Sitting, Cuff Size: Normal)   Pulse (!) 54   Ht 5\' 9"  (1.753 m)   Wt 165 lb 12 oz (75.2 kg)   BMI 24.48 kg/m   General:  Well-developed, well-nourished man seated comfortably in the exam room. He is accompanied by his wife. HEENT: No conjunctival pallor or scleral icterus.  Moist mucous membranes.  OP clear. Neck: Supple without lymphadenopathy, thyromegaly, JVD, or HJR.  No carotid bruit. Lungs: Normal work of breathing.  Clear to auscultation bilaterally without wheezes or crackles. Heart: Regular rate and rhythm without murmurs, rubs, or gallops.  Non-displaced PMI. Abd: Bowel sounds present.  Soft, NT/ND without hepatosplenomegaly Ext: No lower extremity edema.  Radial and  posterior tibial pulses are 2+ bilaterally. Dorsalis pedis pulses are 1+ bilaterally. Skin: warm and dry without rash Neuro: CNIII-XII intact.  Strength and fine-touch sensation intact in upper and lower extremities bilaterally. Psych: Normal mood and affect.  EKG:  I have personally reviewed the tracing.  Normal sinus rhythm with leftward axis. No significant abnormalities.  Lab Results  Component Value Date   WBC 8.6 07/31/2008   HGB 15.1 07/31/2008   HCT 42.7 07/31/2008   MCV 87.6 07/31/2008   PLT 230.0 07/31/2008    Lab Results  Component Value Date   NA 143 07/31/2008   K 5.0 07/31/2008   CL 105 07/31/2008   CO2 32 07/31/2008   BUN 13 07/31/2008   CREATININE 0.9 07/31/2008   GLUCOSE 90 07/31/2008   ALT 30 07/31/2008    Lab Results  Component Value Date   CHOL 179 07/31/2008   HDL 36.10 (L) 07/31/2008   LDLCALC 110 (H) 07/31/2008   TRIG 163.0 (H) 07/31/2008   CHOLHDL 5 07/31/2008    --------------------------------------------------------------------------------------------------  ASSESSMENT AND PLAN: 1. Chest pain and abnormal stress test The patient's progressive angina over the last month in the setting of  an abnormal stress test are very concerning for significant coronary insufficiency. He is chest pain-free today. We have agreed to start isosorbide mononitrate 30 mg daily. I have also prescribed as needed sublingual nitroglycerin should his chest pain recur. I advised him to limit his activity pending cardiac catheterization. We will attempt to schedule him for left heart catheterization with [facility] or Thursday. I have reviewed the risks, indications, and alternatives to cardiac catheterization, possible angioplasty, and stenting with the patient. Risks include but are not limited to bleeding, infection, vascular injury, stroke, myocardial infection, arrhythmia, kidney injury, radiation-related injury in the case of prolonged fluoroscopy  use, emergency cardiac surgery, and death. The patient understands the risks of serious complication is 1-2 in 1000 with diagnostic cardiac cath and 1-2% or less with angioplasty/stenting. I advised Paul Koch to call 911 if he develops chest pain at rest that does not resolve promptly with sublingual nitroglycerin. He will likely need addition of a high-intensity statin. We will defer this until his catheterization has been completed later this week.  2.  Hypertension: Blood pressure is reasonably well controlled today. Will not make any changes to his current medication with the exception of the aforementioned isosorbide mononitrate.  Follow-up: To be determined based on cardiac catheterization results.  Yvonne Kendallhristopher Baldwin Racicot, MD 01/28/2016 4:10 PM

## 2016-01-30 NOTE — Research (Signed)
CADLAD Informed Consent   Subject Name: Paul Koch  Subject met inclusion and exclusion criteria.  The informed consent form, study requirements and expectations were reviewed with the subject and questions and concerns were addressed prior to the signing of the consent form.  The subject verbalized understanding of the trail requirements.  The subject agreed to participate in the CADLAD trial and signed the informed consent.  The informed consent was obtained prior to performance of any protocol-specific procedures for the subject.  A copy of the signed informed consent was given to the subject and a copy was placed in the subject's medical record.  [name] 01/30/2016, [phone]

## 2016-01-30 NOTE — Progress Notes (Signed)
Reviewed all discharge instructions with pt and wife.  Both verbalize understanding and deny further questions.  PA, cardiac rehab, and pharmacy have seen pt. Pt has written info with all meds listed with next doses highlighted. Right radial site unremarkable.

## 2016-01-30 NOTE — Progress Notes (Signed)
[id number] Education completed with pt and wife who voiced understanding. Stressed importance of plavix with stent. Reviewed NTG use, risk factors, ex ed and heart healthy diet. Discussed CRP 2 and will refer to [place] but pt will not be able to attend due to work schedule. Discussed stress at work and emotional support given. Luetta Nuttingharlene Verlon Carcione RN BSN 01/30/2016 2:13 PM

## 2016-01-30 NOTE — Interval H&P Note (Signed)
Cath Lab Visit (complete for each Cath Lab visit)  Clinical Evaluation Leading to the Procedure:   ACS: No.  Non-ACS:    Anginal Classification: CCS III  Anti-ischemic medical therapy: Minimal Therapy (1 class of medications)  Non-Invasive Test Results: Intermediate-risk stress test findings: cardiac mortality 1-3%/year  Prior CABG: No previous CABG      History and Physical Interval Note:  01/30/2016 7:39 AM  Paul Koch  has presented today for surgery, with the diagnosis of abnormal stress test, angina  The various methods of treatment have been discussed with the patient and family. After consideration of risks, benefits and other options for treatment, the patient has consented to  Procedure(s): Left Heart Cath and Coronary Angiography (N/A) as a surgical intervention .  The patient's history has been reviewed, patient examined, no change in status, stable for surgery.  I have reviewed the patient's chart and labs.  Questions were answered to the patient's satisfaction.     Lance MussJayadeep Tynisha Ogan

## 2016-01-30 NOTE — Progress Notes (Signed)
[facility],RN care assumed at this time

## 2016-01-30 NOTE — Discharge Summary (Signed)
Discharge Summary    Patient ID: Paul Koch,  MRN: [id number], DOB/AGE: 12-19-1960 55 y.o.  Admit date: 01/30/2016 Discharge date: 01/30/2016  Primary Care Provider: Gilles Chiquito Primary Cardiologist: Dr. Okey Dupre  Discharge Diagnoses    Active Problems:   Abnormal stress test   Allergies Allergies  Allergen Reactions  . Clindamycin Hcl     REACTION: stomach upset    Diagnostic Studies/Procedures   Coronary Stent Intervention  Left Heart Cath and Coronary Angiography 01/30/16    Mid RCA lesion, 90 %stenosed.  A STENT PROMUS PREM MR [id number] drug eluting stent was successfully placed, postdilated to 3.8 mm.  Post intervention, there is a 0% residual stenosis.  Mild, scattered plaque in the left sided vessels.  The left ventricular systolic function is normal. LV end diastolic pressure is mildly elevated, LVEDP 20 mm Hg.  The left ventricular ejection fraction is 55-65% by visual estimate.  There is no aortic valve stenosis.   Continue dual antiplatelet therapy along with aggressive secondary prevention.  Candidate for same day PCI.  He will need plavix supply before he leaves the hospital.   _____________   History of Present Illness   Mr. Paul Koch is a 55 y.o. year-old male with history of hypertension and anxiety, who was referred by Dr. Ellin Goodie to Dr. Okey Dupre for evaluation of chest pain. The patient reports that he began noticing a squeezing/tightness sensation in his chest with mowing about 1 month ago. This typically resolves within a few minutes. However, it is started occurring with even mild activity such as walking or climbing stairs. He is noted some associated numbness involving the left shoulder as well as exertional dyspnea. The patient's maximal intensity has been 6/10. He noted one episode that occurred at night while lying in bed that lasted about 5-10 minutes. At the time, his blood pressure was 211/120 and remained elevated into the next day. This  prompted him to speak with his PCP, who adjusted his blood pressure medications and referred for myocardial perfusion stress test. The stress test revealed a partially reversible inferior/inferoseptal defect.  He was seen by Dr. Okey Dupre on 01/28/16 in the office, and said that he has continued to have intermittent chest pain, most recently a few days ago while [place] the [place]. He is concerned that a lot of stress at work may also be contributing to his discomfort. He has not undergone previous cardiovascular evaluation and denies a history of known cardiac disease with the exception of a right bundle branch block (not evident on today's EKG). He is currently on metoprolol, ramipril, and amlodipine, which he is tolerating well. He has not had any significant bleeding.   Hospital Course  He was referred for staged heart catheterization on 01/30/16. Full cath report above.  He had a 90% mid RCA lesion that was successfully treated with a DES. Will need DAPT with Plavix and ASA for one year. He is a same day PCI discharge. He will get one month of ASA and Plavix before his is discharged.   His LVEF was normal by visual estimate. His right radial site was stable without hematoma.   He completed 6 hours of bedrest and was discharged home in good condition.    _____________  Discharge Vitals Blood pressure 122/69, pulse 65, temperature 97.9 F (36.6 C), temperature source Oral, resp. rate 18, height 5\' 9"  (1.753 m), weight 165 lb (74.8 kg), SpO2 99 %.  Filed Weights   01/30/16 0542  Weight:  165 lb (74.8 kg)    Labs & Radiologic Studies     CBC  Recent Labs  01/28/16 1627  WBC 8.8  HGB 15.8  HCT 45.8  MCV 85.6  PLT 204   Basic Metabolic Panel  Recent Labs  01/28/16 1627  NA 137  K 4.4  CL 100*  CO2 30  GLUCOSE 104*  BUN 18  CREATININE 1.13  CALCIUM 9.2    Dg Chest 2 View  Result Date: 01/29/2016 CLINICAL DATA:  Pt is having a heart cath on Thursday. Hx of  pneumonia, bronchitis, hypertension. Former smoker EXAM: CHEST  2 VIEW COMPARISON:  None. FINDINGS: The heart size and mediastinal contours are normal. Asymmetric 7 mm nodular density projects over the anterior aspect of the left sixth rib on the frontal examination. This is not clearly seen on the lateral view. Based on small size and conspicuity, this is favored to reflect an asymmetric nipple shadow, granuloma or bone island. The lungs are otherwise clear. There is no pleural effusion. Mild thoracic spine degenerative changes are present. IMPRESSION: No acute findings. Suspected incidental nodular density projecting over the left lung base, not likely to be clinically significant. Repeat frontal examination with nipple markers and lordotic positioning may be helpful for more definitive characterization. These results will be called to the ordering clinician or representative by the Radiologist Assistant, and communication documented in the PACS or zVision Dashboard. Electronically Signed   By: Carey Bullocks M.D.   On: 01/29/2016 08:35    Disposition   Pt is being discharged home today in good condition.  Follow-up Plans & Appointments    Follow-up Information    Yvonne Kendall, MD Follow up on 02/05/2016.   Specialty:  Cardiology Why:  at 2:00 for hospital follow up.  Contact information: [address] [place] [address] [phone]          Discharge Instructions    Amb Referral to Cardiac Rehabilitation    Complete by:  As directed    Cannot do program due to work schedule   Diagnosis:  Coronary Stents   Diet - low sodium heart healthy    Complete by:  As directed    Discharge instructions    Complete by:  As directed    Radial Site Care Refer to this sheet in the next few weeks. These instructions provide you with information on caring for yourself after your procedure. Your caregiver may also give you more specific instructions. Your treatment has been  planned according to current medical practices, but problems sometimes occur. Call your caregiver if you have any problems or questions after your procedure. HOME CARE INSTRUCTIONS You may shower the day after the procedure.Remove the bandage (dressing) and gently wash the site with plain soap and water.Gently pat the site dry.  Do not apply powder or lotion to the site.  Do not submerge the affected site in water for 3 to 5 days.  Inspect the site at least twice daily.  Do not flex or bend the affected arm for 24 hours.  No lifting over 5 pounds (2.3 kg) for 5 days after your procedure.  Do not drive home if you are discharged the same day of the procedure. Have someone else drive you.  You may drive 24 hours after the procedure unless otherwise instructed by your caregiver.  What to expect: Any bruising will usually fade within 1 to 2 weeks.  Blood that collects in the tissue (hematoma) may be  painful to the touch. It should usually decrease in size and tenderness within 1 to 2 weeks.  SEEK IMMEDIATE MEDICAL CARE IF: You have unusual pain at the radial site.  You have redness, warmth, swelling, or pain at the radial site.  You have drainage (other than a small amount of blood on the dressing).  You have chills.  You have a fever or persistent symptoms for more than 72 hours.  You have a fever and your symptoms suddenly get worse.  Your arm becomes pale, cool, tingly, or numb.  You have heavy bleeding from the site. Hold pressure on the site.   Increase activity slowly    Complete by:  As directed       Discharge Medications   Current Discharge Medication List    START taking these medications   Details  aspirin EC 81 MG tablet Take 1 tablet (81 mg total) by mouth daily. Qty: 30 tablet, Refills: 0    !! clopidogrel (PLAVIX) 75 MG tablet Take 1 tablet (75 mg total) by mouth daily. Qty: 30 tablet, Refills: 0    !! clopidogrel (PLAVIX) 75 MG tablet Take 1 tablet (75 mg total)  by mouth daily with breakfast. Qty: 30 tablet, Refills: 12     !! - Potential duplicate medications found. Please discuss with provider.    CONTINUE these medications which have NOT CHANGED   Details  amLODipine (NORVASC) 5 MG tablet Take 5 mg by mouth 2 (two) times daily. Refills: 0    aspirin 81 MG chewable tablet Chew 81 mg by mouth daily.    clonazePAM (KLONOPIN) 1 MG tablet Take 1 mg by mouth 2 (two) times daily. Refills: 2    diphenhydrAMINE (BENADRYL) 50 MG tablet Take 50 mg by mouth at bedtime as needed for sleep.    FLUoxetine (PROZAC) 20 MG capsule TAKE 1 CAPSULE BY MOUTH IN THE MORNING ONCE DAILY Refills: 0    isosorbide mononitrate (IMDUR) 30 MG 24 hr tablet Take 1 tablet (30 mg total) by mouth daily. Qty: 30 tablet, Refills: 3    loratadine (CLARITIN) 10 MG tablet Take 10 mg by mouth daily as needed for allergies.  Refills: 3    metoprolol (LOPRESSOR) 100 MG tablet Take 100 mg by mouth 2 (two) times daily. Refills: 4    nitroGLYCERIN (NITROSTAT) 0.4 MG SL tablet Place 1 tablet (0.4 mg total) under the tongue every 5 (five) minutes as needed for chest pain. Qty: 25 tablet, Refills: 3    ramipril (ALTACE) 10 MG capsule Take 10 mg by mouth 2 (two) times daily.  Refills: 3    zolpidem (AMBIEN) 10 MG tablet Take 10 mg by mouth at bedtime as needed for sleep.  Refills: 5    Camphor-Menthol-Methyl Sal (TIGER BALM MUSCLE RUB EX) Apply 1 application topically daily as needed (knee pain).            Outstanding Labs/Studies    Duration of Discharge Encounter   Greater than 30 minutes including physician time.  Signed, Little IshikawaErin E Smith NP 01/30/2016, 2:52 PM    I have examined the patient and reviewed assessment and plan and discussed with patient.  Agree with above as stated.  S/p PCI of the RCA with long DES.  Stressed importance of DAPT.  Could stop some antianginal therapy at next office visit.  WIll need lipid lowering therapy with target LDL < 100.  Small  hematoma noted immediately post procedure but resolved with compression.  Site stable at the  time of discharge.    Lance Muss

## 2016-01-31 ENCOUNTER — Encounter (HOSPITAL_COMMUNITY): Payer: Self-pay | Admitting: Interventional Cardiology

## 2016-01-31 NOTE — Hospital Discharge Follow-Up (Addendum)
Same Day PCI D/C Phone Call  ° [x]  1st call °        [x]  Answer. °        []  No Answer  ° ° []  2nd call  °      []  Answer °      []  No Answer  ° °Is there bleeding or other abnormality from access site? ° []  Yes   ° [x]  No ° °Has there been any chest pain, nausea or vomiting?   ° [x]  No ° []  Yes chest pain  ° []  Yes nausea or vomiting ° °Have you sought any medical attention since discharge?   ° []  Yes ° [x]  No ° °Remind Patient about their follow up appointment, review discharge & medication instructions.  ° [x]  Yes ° []  No ° °Are you continuing to take you daily ASA & antiplatlet inhibitor(Plavix, Effient, Brilinitia)? ° [x]  Yes ° []  No ° °Would you say you were satisfied with you Same Day Discharge?  °(Disagree, Neutral, Agree) ° ° []  Disagree, why ° []  Neutral ° [x]  Agree ° ° ° ° °

## 2016-02-03 NOTE — Telephone Encounter (Signed)
S/w pt who is agreeable to another CXR this week at [facility]. Dr. Okey DupreEnd discussed w/patient prior to catheterization. He will have CXR Wednesday prior to his appt w/MD. Pt had no further questions at this time.

## 2016-02-05 ENCOUNTER — Telehealth: Payer: Self-pay | Admitting: Internal Medicine

## 2016-02-05 ENCOUNTER — Encounter: Payer: Self-pay | Admitting: Internal Medicine

## 2016-02-05 ENCOUNTER — Ambulatory Visit
Admission: RE | Admit: 2016-02-05 | Discharge: 2016-02-05 | Disposition: A | Discharge status: Home / Self Care | Payer: BLUE CROSS/BLUE SHIELD | Source: Ambulatory Visit | Attending: Internal Medicine | Admitting: Internal Medicine

## 2016-02-05 ENCOUNTER — Ambulatory Visit (INDEPENDENT_AMBULATORY_CARE_PROVIDER_SITE_OTHER): Payer: BLUE CROSS/BLUE SHIELD | Admitting: Internal Medicine

## 2016-02-05 VITALS — BP 110/78 | HR 54 | Ht 69.0 in | Wt 167.8 lb

## 2016-02-05 DIAGNOSIS — R9389 Abnormal findings on diagnostic imaging of other specified body structures: Secondary | ICD-10-CM

## 2016-02-05 DIAGNOSIS — R911 Solitary pulmonary nodule: Secondary | ICD-10-CM | POA: Insufficient documentation

## 2016-02-05 DIAGNOSIS — I2510 Atherosclerotic heart disease of native coronary artery without angina pectoris: Secondary | ICD-10-CM | POA: Diagnosis not present

## 2016-02-05 DIAGNOSIS — I10 Essential (primary) hypertension: Secondary | ICD-10-CM | POA: Diagnosis not present

## 2016-02-05 DIAGNOSIS — Z79899 Other long term (current) drug therapy: Secondary | ICD-10-CM | POA: Diagnosis not present

## 2016-02-05 DIAGNOSIS — R938 Abnormal findings on diagnostic imaging of other specified body structures: Secondary | ICD-10-CM | POA: Diagnosis present

## 2016-02-05 MED ORDER — ATORVASTATIN CALCIUM 20 MG PO TABS: 20.0000 mg | ORAL_TABLET | Freq: Every day | ORAL | 3 refills | Status: DC

## 2016-02-05 NOTE — Telephone Encounter (Signed)
Diane @ [facility] called to confirm we have received today's CXR results.  Results routed to Dr. Okey DupreEnd and he has been notified

## 2016-02-05 NOTE — Patient Instructions (Addendum)
Medication Instructions:  Your physician has recommended you make the following change in your medication:  1-START Lipitor 20 mg by mouth at bedtime 2-STOP Imdur  Labwork: Your physician recommends that you lab work today-CMET and Lipid panel.  Testing/Procedures: CT of your chest, (CAT scanning), is a noninvasive, special x-ray that produces cross-sectional images of the body using x-rays and a computer. CT scans help physicians diagnose and treat medical conditions. For some CT exams, a contrast material is used to enhance visibility in the area of the body being studied. CT scans provide greater clarity and reveal more details than regular x-ray exams.  February 11, 2016 at 8:45 am at [facility] - go to Entrance A and the desk at the front will direct you to radiology.  Follow-Up: Your physician wants you to follow-up in: 3 months with Dr. Okey DupreEnd at [address] [employer] [facility].   If you need a refill on your cardiac medications before your next appointment, please call your pharmacy.

## 2016-02-05 NOTE — Progress Notes (Signed)
Follow-up Outpatient Visit Date: 02/05/2016  Chief Complaint: Follow-up PCI  HPI:  Paul Koch is a 55 y.o. year-old male with history of recently diagnosed CAD with PCI to mid RCA, hypertension, and anxiety, who presents for follow-up of coronary artery disease. If first met Mr. Medici on 01/28/16 due to progressive chest pain and abnormal stress test.  He was referred for [facility] demonstrating severe mid RCA stenosis that was treated with a 3.5 x 38 mm DES.  Today, Mr. Seliga reports feeling much better.  He had a "funny" sensation in his chest the night after PCI, which has since resolved.  He denies further chest pain, shortness of breath, palpitations, lightheadedness, and edema.  He had mild bruising of the right forearm after radial catheterization.  He is tolerating medications well, including DAPT with ASA and clopidogrel.  Pre-cath chest x-ray showed questionable left lung nodule; repeat CXR with nipple markers was performed today.  --------------------------------------------------------------------------------------------------  Cardiovascular History & Procedures: Cardiovascular Problems:  Coronary artery disease s/p PCI (01/2016)  Risk Factors:  Known CAD, HTN, prior tobacco use, male gender, and age > 77  Cath/PCI:  LHC/PCI:  Mild LMCA, LAD, and LCx disease.  90% mid RCA stenosis.  Successful PCI to mid RCA with Promus Premier 3.5 x 38 mm DES. Normal LVEF (55-65%).  CV Surgery:  None  EP Procedures and Devices:  None  Non-Invasive Evaluation(s):  Exercise myocardial perfusion stress test (01/23/16): I have personally reviewed the images.  Moderate size, moderate in severity partially reversible basal inferoseptal, mid inferoseptal, mid inferior, and apical inferior defect consistent with ischemia and possible scar. Treadmill demonstrates 2 mm ST depression in the inferior leads. LVEF greater at 65%.Exercise myocardial perfusion stress test (01/23/16): I have personally  reviewed the images.  Moderate size, moderate in severity partially reversible basal inferoseptal, mid inferoseptal, mid inferior, and apical inferior defect consistent with ischemia and possible scar. Treadmill demonstrates 2 mm ST depression in the inferior leads. LVEF greater at 65%.  Recent CV Pertinent Labs: Lab Results  Component Value Date   CHOL 179 07/31/2008   HDL 36.10 (L) 07/31/2008   LDLCALC 110 (H) 07/31/2008   TRIG 163.0 (H) 07/31/2008   CHOLHDL 5 07/31/2008   INR 1.02 01/28/2016   K 4.4 01/28/2016   BUN 18 01/28/2016   CREATININE 1.13 01/28/2016     Past medical and surgical history were reviewed and updated in EPIC.   Outpatient Encounter Prescriptions as of 02/05/2016  Medication Sig  . amLODipine (NORVASC) 5 MG tablet Take 5 mg by mouth 2 (two) times daily.  Marland Kitchen aspirin 81 MG chewable tablet Chew 81 mg by mouth daily.  Marland Kitchen aspirin EC 81 MG tablet Take 1 tablet (81 mg total) by mouth daily.  . Camphor-Menthol-Methyl Sal (TIGER BALM MUSCLE RUB EX) Apply 1 application topically daily as needed (knee pain).  . clonazePAM (KLONOPIN) 1 MG tablet Take 1 mg by mouth 2 (two) times daily.  . clopidogrel (PLAVIX) 75 MG tablet Take 1 tablet (75 mg total) by mouth daily.  Derrill Memo ON 03/02/2016] clopidogrel (PLAVIX) 75 MG tablet Take 1 tablet (75 mg total) by mouth daily with breakfast.  . diphenhydrAMINE (BENADRYL) 50 MG tablet Take 50 mg by mouth at bedtime as needed for sleep.  Marland Kitchen FLUoxetine (PROZAC) 20 MG capsule TAKE 1 CAPSULE BY MOUTH IN THE MORNING ONCE DAILY  . isosorbide mononitrate (IMDUR) 30 MG 24 hr tablet Take 1 tablet (30 mg total) by mouth daily. (Patient taking differently: Take  30 mg by mouth every evening. )  . loratadine (CLARITIN) 10 MG tablet Take 10 mg by mouth daily as needed for allergies.   . metoprolol (LOPRESSOR) 100 MG tablet Take 100 mg by mouth 2 (two) times daily.  . nitroGLYCERIN (NITROSTAT) 0.4 MG SL tablet Place 1 tablet (0.4 mg total) under the  tongue every 5 (five) minutes as needed for chest pain.  . ramipril (ALTACE) 10 MG capsule Take 10 mg by mouth 2 (two) times daily.   Marland Kitchen zolpidem (AMBIEN) 10 MG tablet Take 10 mg by mouth at bedtime as needed for sleep.    No facility-administered encounter medications on file as of 02/05/2016.     Allergies: Clindamycin hcl  Social History   Social History  . Marital status: Married    Spouse name: N/A  . Number of children: N/A  . Years of education: N/A   Occupational History  . Not on file.   Social History Main Topics  . Smoking status: Former Smoker    Packs/day: 1.00    Years: 20.00    Types: Cigarettes    Quit date: 2004  . Smokeless tobacco: Never Used  . Alcohol use No     Comment: social (5 drinks/year)  . Drug use: No  . Sexual activity: Not on file   Other Topics Concern  . Not on file   Social History Narrative  . No narrative on file    Family History  Problem Relation Age of Onset  . Brain cancer Mother   . Lung cancer Mother   . Arrhythmia Mother   . Hypertension Mother   . Hyperlipidemia Mother   . Bladder Cancer Mother   . Stroke Mother   . Emphysema Father     Review of Systems: A 12-system review of systems was performed and was negative except as noted in the HPI.  --------------------------------------------------------------------------------------------------  Physical Exam: BP 110/78 (BP Location: Left Arm, Patient Position: Sitting, Cuff Size: Normal)   Pulse (!) 54   Ht '5\' 9"'$  (1.753 m)   Wt 167 lb 12 oz (76.1 kg)   BMI 24.77 kg/m   General:  Well-developed, well-nourished man, seated comfortably on exam table.  He is accompanied by his wife. HEENT: No conjunctival pallor or scleral icterus.  Moist mucous membranes.  OP clear. Neck: Supple without lymphadenopathy, thyromegaly, JVD, or HJR.  No carotid bruit. Lungs: Normal work of breathing.  Clear to auscultation bilaterally without wheezes or crackles. Heart: Regular rate  and rhythm without murmurs, rubs, or gallops.  Non-displaced PMI. Abd: Bowel sounds present.  Soft, NT/ND without hepatosplenomegaly Ext: No lower extremity edema.  Radial, PT, and DP pulses are 2+ bilaterally. Mild bruising of right forearm.  Right radial arteriotomy is well healed with normal Allen's test. Skin: warm and dry without rash  EKG:  Sinus bradycardia (heart rate 54 bpm) and left axis deviation. Otherwise, no significant abnormalities.  Chest x-ray: I have personally reviewed the images. There is a 9-10 mm nodule overlying the left lung base that appears to lie within the lung parenchyma.  Lab Results  Component Value Date   WBC 8.8 01/28/2016   HGB 15.8 01/28/2016   HCT 45.8 01/28/2016   MCV 85.6 01/28/2016   PLT 204 01/28/2016    Lab Results  Component Value Date   NA 137 01/28/2016   K 4.4 01/28/2016   CL 100 (L) 01/28/2016   CO2 30 01/28/2016   BUN 18 01/28/2016   CREATININE 1.13 01/28/2016  GLUCOSE 104 (H) 01/28/2016   ALT 30 07/31/2008    Lab Results  Component Value Date   CHOL 179 07/31/2008   HDL 36.10 (L) 07/31/2008   LDLCALC 110 (H) 07/31/2008   TRIG 163.0 (H) 07/31/2008   CHOLHDL 5 07/31/2008    --------------------------------------------------------------------------------------------------  ASSESSMENT AND PLAN: Coronary artery disease: Patient doing well without recurrent chest pain following PCI to RCA.  He is tolerating DAPT with ASA and clopidogrel, which we will continue for at least 6 months.We will start atorvastatin 20 mg daily for secondary prevention. We'll check a complete metabolic panel and a lipid panel today.  Hypertension: Blood pressure is well controlled today we will not make any medication changes at this time.  Solitary lung nodule: Repeat chest x-ray today confirms subcentimeter nodule in the left lower lung. We will obtain CT of the chest with contrast for further characterization. The patient reports he is up-to-date  on his age-appropriate cancer screening including colonoscopy and prostate exam.  Follow-up: Return to clinic in 3 months at [facility] office.  Nelva Bush, MD 02/05/2016 1:59 PM

## 2016-02-06 LAB — LIPID PANEL
CHOL/HDL RATIO: 5.6 ratio — AB (ref 0.0–5.0)
Cholesterol, Total: 162 mg/dL (ref 100–199)
HDL: 29 mg/dL — ABNORMAL LOW (ref >39)
LDL Calculated: 76 mg/dL (ref 0–99)
Triglycerides: 287 mg/dL — ABNORMAL HIGH (ref 0–149)
VLDL Cholesterol Cal: 57 mg/dL — ABNORMAL HIGH (ref 5–40)

## 2016-02-06 LAB — COMPREHENSIVE METABOLIC PANEL
ALT: 19 IU/L (ref 0–44)
AST: 17 IU/L (ref 0–40)
Albumin/Globulin Ratio: 1.6 (ref 1.2–2.2)
Albumin: 4.1 g/dL (ref 3.5–5.5)
Alkaline Phosphatase: 61 IU/L (ref 39–117)
BILIRUBIN TOTAL: 0.3 mg/dL (ref 0.0–1.2)
BUN/Creatinine Ratio: 18 (ref 9–20)
BUN: 17 mg/dL (ref 6–24)
CALCIUM: 8.9 mg/dL (ref 8.7–10.2)
CHLORIDE: 95 mmol/L — AB (ref 96–106)
CO2: 26 mmol/L (ref 18–29)
CREATININE: 0.94 mg/dL (ref 0.76–1.27)
GFR, EST AFRICAN AMERICAN: 104 mL/min/{1.73_m2} (ref >59)
GFR, EST NON AFRICAN AMERICAN: 90 mL/min/{1.73_m2} (ref >59)
GLUCOSE: 78 mg/dL (ref 65–99)
Globulin, Total: 2.6 g/dL (ref 1.5–4.5)
Potassium: 4.9 mmol/L (ref 3.5–5.2)
Sodium: 135 mmol/L (ref 134–144)
TOTAL PROTEIN: 6.7 g/dL (ref 6.0–8.5)

## 2016-02-07 ENCOUNTER — Encounter: Payer: Self-pay | Admitting: Internal Medicine

## 2016-02-11 ENCOUNTER — Ambulatory Visit (HOSPITAL_COMMUNITY)
Admission: RE | Admit: 2016-02-11 | Discharge: 2016-02-11 | Disposition: A | Discharge status: Home / Self Care | Payer: BLUE CROSS/BLUE SHIELD | Source: Ambulatory Visit | Attending: Internal Medicine | Admitting: Internal Medicine

## 2016-02-11 ENCOUNTER — Telehealth: Payer: Self-pay | Admitting: Internal Medicine

## 2016-02-11 ENCOUNTER — Encounter (HOSPITAL_COMMUNITY): Payer: Self-pay

## 2016-02-11 DIAGNOSIS — I700 Atherosclerosis of aorta: Secondary | ICD-10-CM | POA: Diagnosis not present

## 2016-02-11 DIAGNOSIS — R911 Solitary pulmonary nodule: Secondary | ICD-10-CM | POA: Diagnosis not present

## 2016-02-11 DIAGNOSIS — I714 Abdominal aortic aneurysm, without rupture: Secondary | ICD-10-CM | POA: Insufficient documentation

## 2016-02-11 DIAGNOSIS — J439 Emphysema, unspecified: Secondary | ICD-10-CM | POA: Diagnosis not present

## 2016-02-11 DIAGNOSIS — I2510 Atherosclerotic heart disease of native coronary artery without angina pectoris: Secondary | ICD-10-CM | POA: Diagnosis not present

## 2016-02-11 MED ORDER — IOPAMIDOL (ISOVUE-300) INJECTION 61%
INTRAVENOUS | Status: AC
  Administered 2016-02-11: 75 mL
  Filled 2016-02-11: qty 75

## 2016-02-12 ENCOUNTER — Other Ambulatory Visit: Payer: Self-pay

## 2016-02-12 DIAGNOSIS — I714 Abdominal aortic aneurysm, without rupture: Secondary | ICD-10-CM

## 2016-02-12 DIAGNOSIS — I7143 Infrarenal abdominal aortic aneurysm, without rupture: Secondary | ICD-10-CM

## 2016-02-12 DIAGNOSIS — R911 Solitary pulmonary nodule: Secondary | ICD-10-CM

## 2016-02-12 NOTE — Telephone Encounter (Signed)
I spoke with Mr. Dorris CarnesRorer by phone regarding the results of his CT chest yesterday. This revealed a well-circumscribed subcentimeter nodule in the left lower lobe. He was also noted to have atherosclerosis of the aorta with possible aneurysmal dilation of the infrarenal abdominal aorta, incompletely imaged on this study. We have agreed to repeat a noncontrasted CT of the chest in 6 months to ensure stability of the lung nodule. We will also perform an ultrasound examination of the abdominal aorta to evaluate for aneurysm at the patient's earliest convenience.  Yvonne Kendallhristopher Quincee Gittens, MD [facility] Pager: [phone]

## 2016-02-19 ENCOUNTER — Ambulatory Visit: Payer: BLUE CROSS/BLUE SHIELD | Admitting: Internal Medicine

## 2016-03-06 ENCOUNTER — Ambulatory Visit: Payer: BLUE CROSS/BLUE SHIELD | Admitting: Internal Medicine

## 2016-03-18 DIAGNOSIS — N529 Male erectile dysfunction, unspecified: Secondary | ICD-10-CM | POA: Insufficient documentation

## 2016-03-19 ENCOUNTER — Ambulatory Visit: Payer: BLUE CROSS/BLUE SHIELD

## 2016-03-19 ENCOUNTER — Telehealth: Payer: Self-pay | Admitting: Internal Medicine

## 2016-03-19 DIAGNOSIS — I7143 Infrarenal abdominal aortic aneurysm, without rupture: Secondary | ICD-10-CM

## 2016-03-19 DIAGNOSIS — I714 Abdominal aortic aneurysm, without rupture: Secondary | ICD-10-CM

## 2016-03-19 NOTE — Telephone Encounter (Signed)
Patient wants results for todays AAA sent to pcp .

## 2016-03-19 NOTE — Telephone Encounter (Signed)
S/w patient. Let him know the results are not available yet but we will be in touch with results and then at that point could forward them to his PCP.

## 2016-03-24 ENCOUNTER — Other Ambulatory Visit: Payer: Self-pay | Admitting: *Deleted

## 2016-03-24 DIAGNOSIS — Z136 Encounter for screening for cardiovascular disorders: Secondary | ICD-10-CM

## 2016-05-12 ENCOUNTER — Ambulatory Visit (INDEPENDENT_AMBULATORY_CARE_PROVIDER_SITE_OTHER): Payer: BLUE CROSS/BLUE SHIELD | Admitting: Internal Medicine

## 2016-05-12 ENCOUNTER — Encounter: Payer: Self-pay | Admitting: Internal Medicine

## 2016-05-12 VITALS — BP 120/68 | HR 58 | Ht 69.0 in | Wt 168.5 lb

## 2016-05-12 DIAGNOSIS — E782 Mixed hyperlipidemia: Secondary | ICD-10-CM

## 2016-05-12 DIAGNOSIS — I10 Essential (primary) hypertension: Secondary | ICD-10-CM

## 2016-05-12 DIAGNOSIS — I700 Atherosclerosis of aorta: Secondary | ICD-10-CM | POA: Diagnosis not present

## 2016-05-12 DIAGNOSIS — R911 Solitary pulmonary nodule: Secondary | ICD-10-CM

## 2016-05-12 DIAGNOSIS — I2510 Atherosclerotic heart disease of native coronary artery without angina pectoris: Secondary | ICD-10-CM

## 2016-05-12 MED ORDER — ATORVASTATIN CALCIUM 20 MG PO TABS: 20.0000 mg | ORAL_TABLET | Freq: Every day | ORAL | 3 refills | Status: DC

## 2016-05-12 NOTE — Patient Instructions (Signed)
Medication Instructions:  Your physician recommends that you continue on your current medications as directed. Please refer to the Current Medication list given to you today.   Labwork: NONE  Testing/Procedures: CHEST CT scanning, (CAT scanning), is a noninvasive, special x-ray that produces cross-sectional images of the body using x-rays and a computer. CT scans help physicians diagnose and treat medical conditions. For some CT exams, a contrast material is used to enhance visibility in the area of the body being studied. CT scans provide greater clarity and reveal more details than regular x-ray exams. IN MAY 2018.    Follow-Up: Your physician wants you to follow-up in: 6 MONTHS WITH DR END. You will receive a reminder letter in the mail two months in advance. If you don't receive a letter, please call our office to schedule the follow-up appointment.  If you need a refill on your cardiac medications before your next appointment, please call your pharmacy.     CT Scan A CT scan is a kind of X-ray that makes pictures of parts of your body. It is a painless way for your doctor to see inside your body. The CT scanner is a large machine that has an opening. Your body moves through the opening so that pictures can be taken. BEFORE THE PROCEDURE  The day before the test, do not have drinks with caffeine. Drinks with caffeine include energy drinks, tea, soda, coffee, and hot chocolate.  On the day of the test:  Do not eat at least 4 hours before the test or as told by your doctor.  Do not drink anything but water at least 4 hours before the test or as told by your doctor.  Leave your jewelry at home. You will have to take off some or all of your clothing. Your doctor will give you a hospital gown to wear. PROCEDURE   You will lie on a table with your arms above your head.  An IV tube may be put in your arm. If this is done, liquid will be put through the tube. It may make you feel warm  or give you a metal taste in your mouth.  The table you will be lying on will move into a large machine.  You will be able to see, hear, and talk to the person running the machine while you are in it. Follow that person's directions.  The machine will move around you to take pictures. Do not move.  When the machine is done taking pictures it will be turned off.  The table will be moved out of the machine.  If you had an IV tube put in, it will be removed. AFTER THE PROCEDURE Ask your doctor when to call or come in for your test results. This information is not intended to replace advice given to you by your health care provider. Make sure you discuss any questions you have with your health care provider. Document Released: 06/19/2008 Document Revised: 03/28/2013 Document Reviewed: 12/07/2012 Elsevier Interactive Patient Education  2017 [employer]

## 2016-05-12 NOTE — Progress Notes (Signed)
Follow-up Outpatient Visit Date: 05/12/2016  Chief Complaint: Follow-up PCI  HPI:  Mr. Holsworth is a 56 y.o. year-old male with history of recently diagnosed CAD with PCI to mid RCA, hypertension, and anxiety, who presents for follow-up of coronary artery disease. I last saw him on 02/05/16 following PCI to the mid RCA. Since that time, he has done well without any further episodes of chest pain. He denies shortness of breath, palpitations, lightheadedness, and edema. Since our last visit, the patient was seen by his PCP; atorvastatin was discontinued. He was tolerating the medication well but was not sure that he needed to take it. He is compliant with dual antiplatelet therapy. He has not had any significant bleeding.  --------------------------------------------------------------------------------------------------  Cardiovascular History & Procedures: Cardiovascular Problems:  Coronary artery disease s/p PCI (01/2016)  Risk Factors:  Known CAD, HTN, prior tobacco use, male gender, and age > 14  Cath/PCI:  LHC/PCI:  Mild LMCA, LAD, and LCx disease.  90% mid RCA stenosis.  Successful PCI to mid RCA with Promus Premier 3.5 x 38 mm DES. Normal LVEF (55-65%).  CV Surgery:  None  EP Procedures and Devices:  None  Non-Invasive Evaluation(s):  Abdominal aortic ultrasound (03/19/16): Atherosclerotic plaquing of the abdominal aorta noted, which is upper normal in size, measuring up to 2.9 cm in diameter.  Exercise myocardial perfusion stress test (01/23/16): I have personally reviewed the images.  Moderate size, moderate in severity partially reversible basal inferoseptal, mid inferoseptal, mid inferior, and apical inferior defect consistent with ischemia and possible scar. Treadmill demonstrates 2 mm ST depression in the inferior leads. LVEF greater at 65%.Exercise myocardial perfusion stress test (01/23/16): I have personally reviewed the images.  Moderate size, moderate in severity  partially reversible basal inferoseptal, mid inferoseptal, mid inferior, and apical inferior defect consistent with ischemia and possible scar. Treadmill demonstrates 2 mm ST depression in the inferior leads. LVEF greater at 65%.  Recent CV Pertinent Labs: Lab Results  Component Value Date   CHOL 162 02/05/2016   HDL 29 (L) 02/05/2016   LDLCALC 76 02/05/2016   TRIG 287 (H) 02/05/2016   CHOLHDL 5.6 (H) 02/05/2016   CHOLHDL 5 07/31/2008   INR 1.02 01/28/2016   K 4.9 02/05/2016   BUN 17 02/05/2016   CREATININE 0.94 02/05/2016     Past medical and surgical history were reviewed and updated in EPIC.   Outpatient Encounter Prescriptions as of 05/12/2016  Medication Sig  . amLODipine (NORVASC) 5 MG tablet Take 5 mg by mouth 2 (two) times daily.  Marland Kitchen aspirin EC 81 MG tablet Take 1 tablet (81 mg total) by mouth daily.  . Camphor-Menthol-Methyl Sal (TIGER BALM MUSCLE RUB EX) Apply 1 application topically daily as needed (knee pain).  . clonazePAM (KLONOPIN) 1 MG tablet Take 1 mg by mouth 2 (two) times daily.  . clopidogrel (PLAVIX) 75 MG tablet Take 1 tablet (75 mg total) by mouth daily.  . diphenhydrAMINE (BENADRYL) 50 MG tablet Take 50 mg by mouth at bedtime as needed for sleep.  Marland Kitchen FLUoxetine (PROZAC) 20 MG capsule TAKE 1 CAPSULE BY MOUTH IN THE MORNING ONCE DAILY along with 40mg  tablet for total dose of 60mg  daily.  Marland Kitchen FLUoxetine (PROZAC) 40 MG capsule Take 40 mg by mouth daily.  Marland Kitchen loratadine (CLARITIN) 10 MG tablet Take 10 mg by mouth daily as needed for allergies.   . metoprolol (LOPRESSOR) 100 MG tablet Take 100 mg by mouth 2 (two) times daily.  . nitroGLYCERIN (NITROSTAT) 0.4 MG SL  tablet Place 1 tablet (0.4 mg total) under the tongue every 5 (five) minutes as needed for chest pain.  . ramipril (ALTACE) 10 MG capsule Take 10 mg by mouth 2 (two) times daily.   Marland Kitchen zolpidem (AMBIEN) 10 MG tablet Take 10 mg by mouth at bedtime as needed for sleep.   Marland Kitchen atorvastatin (LIPITOR) 20 MG tablet Take 1  tablet (20 mg total) by mouth daily at 6 PM.  . [DISCONTINUED] aspirin 81 MG chewable tablet Chew 81 mg by mouth daily.  . [DISCONTINUED] atorvastatin (LIPITOR) 20 MG tablet Take 1 tablet (20 mg total) by mouth daily.  . [DISCONTINUED] clopidogrel (PLAVIX) 75 MG tablet Take 1 tablet (75 mg total) by mouth daily with breakfast. (Patient not taking: Reported on 05/12/2016)   No facility-administered encounter medications on file as of 05/12/2016.     Allergies: Clindamycin hcl  Social History   Social History  . Marital status: Married    Spouse name: N/A  . Number of children: N/A  . Years of education: N/A   Occupational History  . Not on file.   Social History Main Topics  . Smoking status: Former Smoker    Packs/day: 1.00    Years: 20.00    Types: Cigarettes    Quit date: 2004  . Smokeless tobacco: Never Used  . Alcohol use No     Comment: social (5 drinks/year)  . Drug use: No  . Sexual activity: Not on file   Other Topics Concern  . Not on file   Social History Narrative  . No narrative on file    Family History  Problem Relation Age of Onset  . Brain cancer Mother   . Lung cancer Mother   . Arrhythmia Mother   . Hypertension Mother   . Hyperlipidemia Mother   . Bladder Cancer Mother   . Stroke Mother   . Emphysema Father     Review of Systems: Patient notes increased stress, which has improved with escalation of fluoxetine. Otherwise, a 12-system review of systems was performed and was negative except as noted in the HPI.  --------------------------------------------------------------------------------------------------  Physical Exam: BP 120/68 (BP Location: Left Arm, Patient Position: Sitting, Cuff Size: Normal)   Pulse (!) 58   Ht 5\' 9"  (1.753 m)   Wt 168 lb 8 oz (76.4 kg)   BMI 24.88 kg/m   General:  Well-developed, well-nourished man, seated comfortably on exam table. HEENT: No conjunctival pallor or scleral icterus.  Moist mucous membranes.  OP  clear. Neck: Supple without lymphadenopathy, thyromegaly, JVD, or HJR.  No carotid bruit. Lungs: Normal work of breathing.  Clear to auscultation bilaterally without wheezes or crackles. Heart: Regular rate and rhythm without murmurs, rubs, or gallops.  Non-displaced PMI. Abd: Bowel sounds present.  Soft, NT/ND without hepatosplenomegaly Ext: No lower extremity edema.  Radial, PT, and DP pulses are 2+ bilaterally. Right radial arteriotomy site is well-healed. Skin: warm and dry without rash  EKG:  Sinus bradycardia (heart rate 58 bpm) without significant abnormalities.   Lab Results  Component Value Date   WBC 8.8 01/28/2016   HGB 15.8 01/28/2016   HCT 45.8 01/28/2016   MCV 85.6 01/28/2016   PLT 204 01/28/2016    Lab Results  Component Value Date   NA 135 02/05/2016   K 4.9 02/05/2016   CL 95 (L) 02/05/2016   CO2 26 02/05/2016   BUN 17 02/05/2016   CREATININE 0.94 02/05/2016   GLUCOSE 78 02/05/2016   ALT 19 02/05/2016  Lab Results  Component Value Date   CHOL 162 02/05/2016   HDL 29 (L) 02/05/2016   LDLCALC 76 02/05/2016   TRIG 287 (H) 02/05/2016   CHOLHDL 5.6 (H) 02/05/2016    --------------------------------------------------------------------------------------------------  ASSESSMENT AND PLAN: Coronary artery disease: No chest pain or shortness of breath to suggest progressive CAD. He remains on dual antiplatelet therapy with aspirin and clopidogrel, which she is tolerating well. I encouraged him to remain active, as he does not believe that he would be able to participate in cardiac rehabilitation due to his schedule.  Hypertension: Blood pressure is well controlled today we will not make any medication changes at this time.  Mixed hyperlipidemia Patient was previously on atorvastatin, though this was discontinued at his last visit with Dr. Ellin Goodieabinowitz. Though his LDL in November was only slightly above goal at 7176, I feel that he would benefit from aggressive  lipid therapy given his significant CAD status post PCI. Mr. Dorris CarnesRorer has agreed to restart atorvastatin 20 mg daily. We will recheck a lipid panel returns for follow-up and consider further escalation if his LDL remains above 70.  Ectasia of the abdominal aorta Abdominal ultrasound showed atherosclerosis and ectasia of the abdominal aorta measuring up to 2.9 cm in diameter. We will consider repeating an ultrasound in one year to evaluate for enlargement.  Solitary lung nodule: CT chest in 02/2016 demonstrated a well circumscribed nodule measuring 6.7 x 8.3 mm in the left lower lobe. We will repeat unenhanced CT chest in 08/2016 to ensure stability.  Follow-up: Return to clinic in 6 months.  Yvonne Kendallhristopher Kazoua Gossen, MD 05/12/2016 11:30 AM

## 2016-05-14 ENCOUNTER — Ambulatory Visit: Payer: BLUE CROSS/BLUE SHIELD | Admitting: Internal Medicine

## 2016-05-14 DIAGNOSIS — E785 Hyperlipidemia, unspecified: Secondary | ICD-10-CM | POA: Insufficient documentation

## 2016-05-14 DIAGNOSIS — I2510 Atherosclerotic heart disease of native coronary artery without angina pectoris: Secondary | ICD-10-CM | POA: Insufficient documentation

## 2016-05-14 DIAGNOSIS — R911 Solitary pulmonary nodule: Secondary | ICD-10-CM | POA: Insufficient documentation

## 2016-05-14 DIAGNOSIS — I700 Atherosclerosis of aorta: Secondary | ICD-10-CM | POA: Insufficient documentation

## 2016-08-19 ENCOUNTER — Telehealth: Payer: Self-pay | Admitting: *Deleted

## 2016-08-19 NOTE — Telephone Encounter (Signed)
-----   Message from [name], MD sent at 08/18/2016  9:30 PM EDT ----- Regarding: f/u chest ct Hi Paul Koch,  Can you help set [name] up for a CT chest without contrast at his earliest convenience to evaluate the stability of a previously discovered lung nodule? Thanks.  Thayer Ohmhris

## 2016-08-19 NOTE — Telephone Encounter (Signed)
Spoke with patient regarding scheduling of chest CT. Patient is off work May 21, 24, 25, 29, and 30, so these days he is available for the CT. Will contact scheduling and return call to patient regarding details.

## 2016-08-19 NOTE — Telephone Encounter (Signed)
CT chest scheduled for 08/28/16 at 0800am, [facility]. Patient does not have to be NPO or need lab work because CT is without contrast.

## 2016-08-19 NOTE — Telephone Encounter (Signed)
Returned call to patient. He verbalized understanding to arrive at 0745 on 08/28/16 at [facility].  He had no further questions at this time.

## 2016-08-28 ENCOUNTER — Ambulatory Visit
Admission: RE | Admit: 2016-08-28 | Discharge: 2016-08-28 | Disposition: A | Discharge status: Home / Self Care | Payer: BLUE CROSS/BLUE SHIELD | Source: Ambulatory Visit | Attending: Internal Medicine | Admitting: Internal Medicine

## 2016-08-28 DIAGNOSIS — R911 Solitary pulmonary nodule: Secondary | ICD-10-CM | POA: Insufficient documentation

## 2016-08-28 DIAGNOSIS — J432 Centrilobular emphysema: Secondary | ICD-10-CM | POA: Insufficient documentation

## 2016-08-28 DIAGNOSIS — I2510 Atherosclerotic heart disease of native coronary artery without angina pectoris: Secondary | ICD-10-CM | POA: Insufficient documentation

## 2016-09-01 ENCOUNTER — Other Ambulatory Visit: Payer: Self-pay | Admitting: *Deleted

## 2016-09-01 DIAGNOSIS — R911 Solitary pulmonary nodule: Secondary | ICD-10-CM

## 2017-02-08 ENCOUNTER — Telehealth: Payer: Self-pay | Admitting: Internal Medicine

## 2017-02-08 NOTE — Telephone Encounter (Signed)
S/w patient. He states he has had his stent for just over a year now. He has enough Plavix to get him to the next appointment which is this Wednesday, 02/10/17, but does not want to pick up the Rx if Dr End is going to take him off it. Advised pt it would be best to discuss this with him at his appointment this week and Dr End would let him know the plan of care. He verbalized understanding.

## 2017-02-08 NOTE — Telephone Encounter (Signed)
Pt is not sure if he is to still take his blood thinner  Would like advise on this

## 2017-02-10 ENCOUNTER — Encounter: Payer: Self-pay | Admitting: Internal Medicine

## 2017-02-10 ENCOUNTER — Telehealth: Payer: Self-pay | Admitting: *Deleted

## 2017-02-10 ENCOUNTER — Ambulatory Visit (INDEPENDENT_AMBULATORY_CARE_PROVIDER_SITE_OTHER): Payer: BLUE CROSS/BLUE SHIELD | Admitting: Internal Medicine

## 2017-02-10 VITALS — BP 100/78 | HR 52 | Ht 69.0 in | Wt 169.5 lb

## 2017-02-10 DIAGNOSIS — E782 Mixed hyperlipidemia: Secondary | ICD-10-CM

## 2017-02-10 DIAGNOSIS — M79605 Pain in left leg: Secondary | ICD-10-CM | POA: Diagnosis not present

## 2017-02-10 DIAGNOSIS — Z79899 Other long term (current) drug therapy: Secondary | ICD-10-CM

## 2017-02-10 DIAGNOSIS — R911 Solitary pulmonary nodule: Secondary | ICD-10-CM | POA: Diagnosis not present

## 2017-02-10 DIAGNOSIS — I10 Essential (primary) hypertension: Secondary | ICD-10-CM

## 2017-02-10 DIAGNOSIS — I2510 Atherosclerotic heart disease of native coronary artery without angina pectoris: Secondary | ICD-10-CM | POA: Diagnosis not present

## 2017-02-10 DIAGNOSIS — M79604 Pain in right leg: Secondary | ICD-10-CM | POA: Diagnosis not present

## 2017-02-10 MED ORDER — METOPROLOL TARTRATE 50 MG PO TABS: 50.0000 mg | ORAL_TABLET | Freq: Two times a day (BID) | ORAL | 3 refills | Status: DC

## 2017-02-10 MED ORDER — NITROGLYCERIN 0.4 MG SL SUBL: 0.4000 mg | SUBLINGUAL_TABLET | SUBLINGUAL | 3 refills | Status: DC | PRN

## 2017-02-10 MED ORDER — CLOPIDOGREL BISULFATE 75 MG PO TABS: 75.0000 mg | ORAL_TABLET | Freq: Every day | ORAL | 0 refills | Status: DC

## 2017-02-10 NOTE — Telephone Encounter (Signed)
Dr. Okey DupreEnd requested lab work from patient's PCP. Lab work was from 02/2016. Dr End would like patient to have additional lab work done at this earliest convenience (CMP, CBC, FASTING LIPID). Patient will need to be fasting for the labs.  Attempted to call both numbers listed. No voicemail on either number.

## 2017-02-10 NOTE — Progress Notes (Signed)
Follow-up Outpatient Visit Date: 02/10/2017  Primary Care Provider: Gilles Koch, Paul H, MD [address] [place] [id number]  Chief Complaint: Follow-up coronary artery disease  HPI:  Paul Koch is a 56 y.o. year-old male with history of coronary artery disease status post PCI to the mid RCA (01/2016), hypertension, and anxiety who presents for follow-up of coronary artery disease.  I last saw Paul Koch in February, at which time he was doing well.  We agreed to restart atorvastatin at that visit.  He is tolerating the medication well.  Today, Paul Koch overall reports feeling well.  He has noted some fatigue, having less energy than he did 5 or 6 years ago.  He denies chest pain, shortness of breath, lightheadedness, orthopnea, PND, and edema.  He has rare palpitations.  He notes occasional calf cramping/tightness after climbing several flights of stairs.  This is stable and does not occur with walking.  Paul Koch notes that his mother passed away last spring.  His stress and anxiety have been relatively well controlled with his current medications, including Prozac and Klonopin.  He remains on dual antiplatelet therapy with aspirin and clopidogrel.  He bruises easily but has not had any significant bleeding.  --------------------------------------------------------------------------------------------------  Cardiovascular History & Procedures: Cardiovascular Problems:  Coronary artery disease s/p PCI (01/2016)  Risk Factors:  Known CAD, HTN, prior tobacco use, male gender, and age > 58  Cath/PCI:  LHC/PCI (01/30/16):  Mild LMCA, LAD, and LCx disease.  90% mid RCA stenosis.  Successful PCI to mid RCA with Promus Premier 3.5 x 38 mm DES. Normal LVEF (55-65%).  CV Surgery:  None  EP Procedures and Devices:  None  Non-Invasive Evaluation(s):  Abdominal aortic ultrasound (03/19/16): Atherosclerotic plaquing of the abdominal aorta noted, which is upper normal in size,  measuring up to 2.9 cm in diameter.  Exercise myocardial perfusion stress test (01/23/16): Moderate size, moderate in severitypartially reversiblebasal inferoseptal, mid inferoseptal, mid inferior, and apical inferior defect consistent with ischemiaand possible scar. Treadmill demonstrates 2 mm ST depression in the inferior leads. LVEF greater at 65%.Exercise myocardial perfusion stress test (01/23/16): I have personally reviewed the images. Moderate size, moderate in severitypartially reversiblebasal inferoseptal, mid inferoseptal, mid inferior, and apical inferior defect consistent with ischemiaand possible scar. Treadmill demonstrates 2 mm ST depression in the inferior leads. LVEF greater at 65%.  Recent CV Pertinent Labs: Lab Results  Component Value Date   CHOL 162 02/05/2016   HDL 29 (L) 02/05/2016   LDLCALC 76 02/05/2016   TRIG 287 (H) 02/05/2016   CHOLHDL 5.6 (H) 02/05/2016   CHOLHDL 5 07/31/2008   INR 1.02 01/28/2016   K 4.9 02/05/2016   BUN 17 02/05/2016   CREATININE 0.94 02/05/2016    Past medical and surgical history were reviewed and updated in EPIC.  Current Meds  Medication Sig  . amLODipine (NORVASC) 5 MG tablet Take 5 mg by mouth 2 (two) times daily.  Marland Kitchen. aspirin EC 81 MG tablet Take 1 tablet (81 mg total) by mouth daily.  Marland Kitchen. atorvastatin (LIPITOR) 20 MG tablet Take 1 tablet (20 mg total) by mouth daily at 6 PM.  . Camphor-Menthol-Methyl Sal (TIGER BALM MUSCLE RUB EX) Apply 1 application topically daily as needed (knee pain).  . clonazePAM (KLONOPIN) 1 MG tablet Take 1 mg by mouth 2 (two) times daily.  . clopidogrel (PLAVIX) 75 MG tablet Take 1 tablet (75 mg total) by mouth daily.  . diphenhydrAMINE (BENADRYL) 50 MG tablet Take 50 mg by mouth  at bedtime as needed for sleep.  Marland Kitchen FLUoxetine (PROZAC) 20 MG tablet Take 20 mg daily by mouth.  Marland Kitchen FLUoxetine (PROZAC) 40 MG capsule Take 40 mg by mouth daily.  Marland Kitchen loratadine (CLARITIN) 10 MG tablet Take 10 mg by mouth daily as  needed for allergies.   . metoprolol (LOPRESSOR) 100 MG tablet Take 100 mg by mouth 2 (two) times daily.  . nitroGLYCERIN (NITROSTAT) 0.4 MG SL tablet Place 1 tablet (0.4 mg total) under the tongue every 5 (five) minutes as needed for chest pain.  . ramipril (ALTACE) 10 MG capsule Take 10 mg by mouth 2 (two) times daily.   Marland Kitchen zolpidem (AMBIEN) 10 MG tablet Take 10 mg by mouth at bedtime as needed for sleep.     Allergies: Clindamycin hcl  Social History   Socioeconomic History  . Marital status: Married    Spouse name: Not on file  . Number of children: Not on file  . Years of education: Not on file  . Highest education level: Not on file  Social Needs  . Financial resource strain: Not on file  . Food insecurity - worry: Not on file  . Food insecurity - inability: Not on file  . Transportation needs - medical: Not on file  . Transportation needs - non-medical: Not on file  Occupational History  . Not on file  Tobacco Use  . Smoking status: Former Smoker    Packs/day: 1.00    Years: 20.00    Pack years: 20.00    Types: Cigarettes    Last attempt to quit: 2004    Years since quitting: 14.8  . Smokeless tobacco: Never Used  Substance and Sexual Activity  . Alcohol use: No    Comment: social (5 drinks/year)  . Drug use: No  . Sexual activity: Not on file  Other Topics Concern  . Not on file  Social History Narrative  . Not on file    Family History  Problem Relation Age of Onset  . Brain cancer Mother   . Lung cancer Mother   . Arrhythmia Mother   . Hypertension Mother   . Hyperlipidemia Mother   . Bladder Cancer Mother   . Stroke Mother   . Emphysema Father     Review of Systems: A 12-system review of systems was performed and was negative except as noted in the HPI.  --------------------------------------------------------------------------------------------------  Physical Exam: BP 100/78 (BP Location: Left Arm, Patient Position: Sitting, Cuff Size:  Normal)   Pulse (!) 52   Ht 5\' 9"  (1.753 m)   Wt 169 lb 8 oz (76.9 kg)   BMI 25.03 kg/m   General: Well-developed, well-nourished man, seated comfortably in the exam room. HEENT: No conjunctival pallor or scleral icterus. Moist mucous membranes.  OP clear. Neck: Supple without lymphadenopathy, thyromegaly, JVD, or HJR. Lungs: Normal work of breathing. Clear to auscultation bilaterally without wheezes or crackles. Heart: Regular rate and rhythm without murmurs, rubs, or gallops. Non-displaced PMI. Abd: Bowel sounds present. Soft, NT/ND without hepatosplenomegaly Ext: No lower extremity edema.  Pulses are 2+.  Posterior tibial and dorsalis pedis pulses are 1+. Skin: Warm and dry without rash.  EKG: This bradycardia.  Otherwise, no significant abnormalities.  Lab Results  Component Value Date   WBC 8.8 01/28/2016   HGB 15.8 01/28/2016   HCT 45.8 01/28/2016   MCV 85.6 01/28/2016   PLT 204 01/28/2016    Lab Results  Component Value Date   NA 135 02/05/2016   K  4.9 02/05/2016   CL 95 (L) 02/05/2016   CO2 26 02/05/2016   BUN 17 02/05/2016   CREATININE 0.94 02/05/2016   GLUCOSE 78 02/05/2016   ALT 19 02/05/2016    Lab Results  Component Value Date   CHOL 162 02/05/2016   HDL 29 (L) 02/05/2016   LDLCALC 76 02/05/2016   TRIG 287 (H) 02/05/2016   CHOLHDL 5.6 (H) 02/05/2016    --------------------------------------------------------------------------------------------------  ASSESSMENT AND PLAN: Coronary artery disease without angina Paul Koch is doing well without recurrent chest pain.  We have discussed the risks and benefits of extended dual antiplatelet therapy and have agreed to continue with indefinite aspirin and clopidogrel.  If he develops significant bleeding or requires invasive procedures, clopidogrel can be held.  I will check a CBC to ensure stable hemoglobin and platelets.  Hypertension Blood pressure is low normal with sinus bradycardia.  We will decrease  metoprolol tartrate to 50 mg daily.  Hyperlipidemia Most recent labs in November, 2017, showed LDL just above goal at 76.  Paul Koch is tolerating atorvastatin well.  We will recheck a complete metabolic panel and lipid panel at his convenience.  Solitary lung nodule Lung nodule was stable and 08/2016.  We will repeat a noncontrasted CT of the chest in 08/2017 to ensure stability.  Leg pain Paul Koch notes some cramping/tightness in his calves when walking up several flights of stairs but does not have discomfort with any other activity.  Pedal pulses are mildly diminished on exam today.  No sores are appreciated.  We will defer further testing at this time.  Follow-up: Return to clinic in 8 months.  Yvonne Kendallhristopher Kees Idrovo, MD 02/10/2017 8:55 AM

## 2017-02-10 NOTE — Patient Instructions (Signed)
Medication Instructions:  Your physician has recommended you make the following change in your medication:  1- DECREASE Metoprolol to 50 mg by mouth two times a day.   Labwork: NONE  Testing/Procedures: Non-Cardiac CT CHEST NO CONTRAST scanning, (CAT scanning), is a noninvasive, special x-ray that produces cross-sectional images of the body using x-rays and a computer. CT scans help physicians diagnose and treat medical conditions. For some CT exams, a contrast material is used to enhance visibility in the area of the body being studied. CT scans provide greater clarity and reveal more details than regular x-ray exams. - IN MAY 2019.    Follow-Up: Your physician wants you to follow-up in: 8 MONTHS WITH DR END.  You will receive a reminder letter in the mail two months in advance. If you don't receive a letter, please call our office to schedule the follow-up appointment.   If you need a refill on your cardiac medications before your next appointment, please call your pharmacy.

## 2017-02-16 NOTE — Telephone Encounter (Signed)
Attempted home number listed, No answer after several rings and no voicemail. Attempted mobile number and no answer. VM not set up yet.

## 2017-02-17 NOTE — Telephone Encounter (Signed)
Patient not home. Wife, Verlon AuLeslie, answered, ok per DPR.  She took information that patient needed CBC, CMP, and fasting LIPID panel at his earliest convenience. Patient can get lab work with the city through Dr [facility] for no charge. She will have patient set up a time to get lab work through the city and then have them fax it to our office.

## 2017-03-18 ENCOUNTER — Other Ambulatory Visit: Payer: Self-pay

## 2017-03-18 MED ORDER — CLOPIDOGREL BISULFATE 75 MG PO TABS: 75.0000 mg | ORAL_TABLET | Freq: Every day | ORAL | 6 refills | Status: DC

## 2017-03-18 NOTE — Telephone Encounter (Signed)
Refill sent for plavix 75 mg 

## 2017-03-24 NOTE — Telephone Encounter (Signed)
Pt calling stating pt is needing us to please send lab orders to Dr Corliss Skainsobinowitz office.   He states he is going there this Friday to get labs drawn

## 2017-03-25 NOTE — Telephone Encounter (Signed)
Called Dr. Ellin Goodieabinowitz office at [phone] to get their fax # to send lab orders. Orders for CBC/ lipid/ CMET faxed to [phone]- confirmation received.

## 2017-04-12 ENCOUNTER — Encounter: Payer: Self-pay | Admitting: Internal Medicine

## 2017-04-29 ENCOUNTER — Telehealth: Payer: Self-pay | Admitting: Internal Medicine

## 2017-04-29 NOTE — Telephone Encounter (Signed)
S/w patient's wife. Let her know we did indeed fax the labs to Dr Ellin Goodieabinowitz on 03/25/17. She stated she will call their office to see what it is on their end that is needed and call us back if there's anything else we need to do.

## 2017-04-29 NOTE — Telephone Encounter (Signed)
Pt wife would like to know if pt still has to be on Plavix. Pt is able to get EKG and labs performed at his place of employment for no charge. Please advise if pt can get labwork performed at his place of employment.  Pt wife states there was a phone call made to our office requesting these lab orders be sent to him employer, but they have not received them yet.  After 5 call pt cell, Pt cell is [phone]

## 2017-04-29 NOTE — Telephone Encounter (Signed)
Patient wife called to say that she was in contact with other office and they will be faxing over information Patient wife would like Victorino DikeJennifer to contact once they are received to discuss the next move

## 2017-04-30 NOTE — Telephone Encounter (Signed)
Notified patient's wife that we received his lab work and will scan it into Epic for Dr End to review.

## 2017-04-30 NOTE — Telephone Encounter (Signed)
S/w patient's wife and let her know lab work was reviewed by Dr End on 04/14/17 and he sent the patient a MyChart message with results. I reviewed results with her and she verbalized understanding and will have patient check his MyChart.

## 2017-05-05 ENCOUNTER — Other Ambulatory Visit: Payer: Self-pay | Admitting: Internal Medicine

## 2017-05-05 DIAGNOSIS — I714 Abdominal aortic aneurysm, without rupture, unspecified: Secondary | ICD-10-CM

## 2017-05-21 ENCOUNTER — Ambulatory Visit (INDEPENDENT_AMBULATORY_CARE_PROVIDER_SITE_OTHER): Payer: BLUE CROSS/BLUE SHIELD

## 2017-05-21 DIAGNOSIS — I714 Abdominal aortic aneurysm, without rupture, unspecified: Secondary | ICD-10-CM

## 2017-05-25 ENCOUNTER — Other Ambulatory Visit: Payer: Self-pay | Admitting: *Deleted

## 2017-05-25 DIAGNOSIS — I700 Atherosclerosis of aorta: Secondary | ICD-10-CM

## 2017-05-25 DIAGNOSIS — I719 Aortic aneurysm of unspecified site, without rupture: Secondary | ICD-10-CM

## 2017-07-23 ENCOUNTER — Other Ambulatory Visit: Payer: Self-pay | Admitting: Internal Medicine

## 2017-09-21 IMAGING — CR DG CHEST 2V
1 series · 2 of 2 positions shown · non-contrast
Comparison: None.

CLINICAL DATA: Pt is having a heart cath on [REDACTED]. Hx of
pneumonia, bronchitis, hypertension. Former smoker

EXAM:
CHEST  2 VIEW

[Series 1: w chest pa · 0.14mm/px · 2 of 2 slices shown]
[im 1/2]
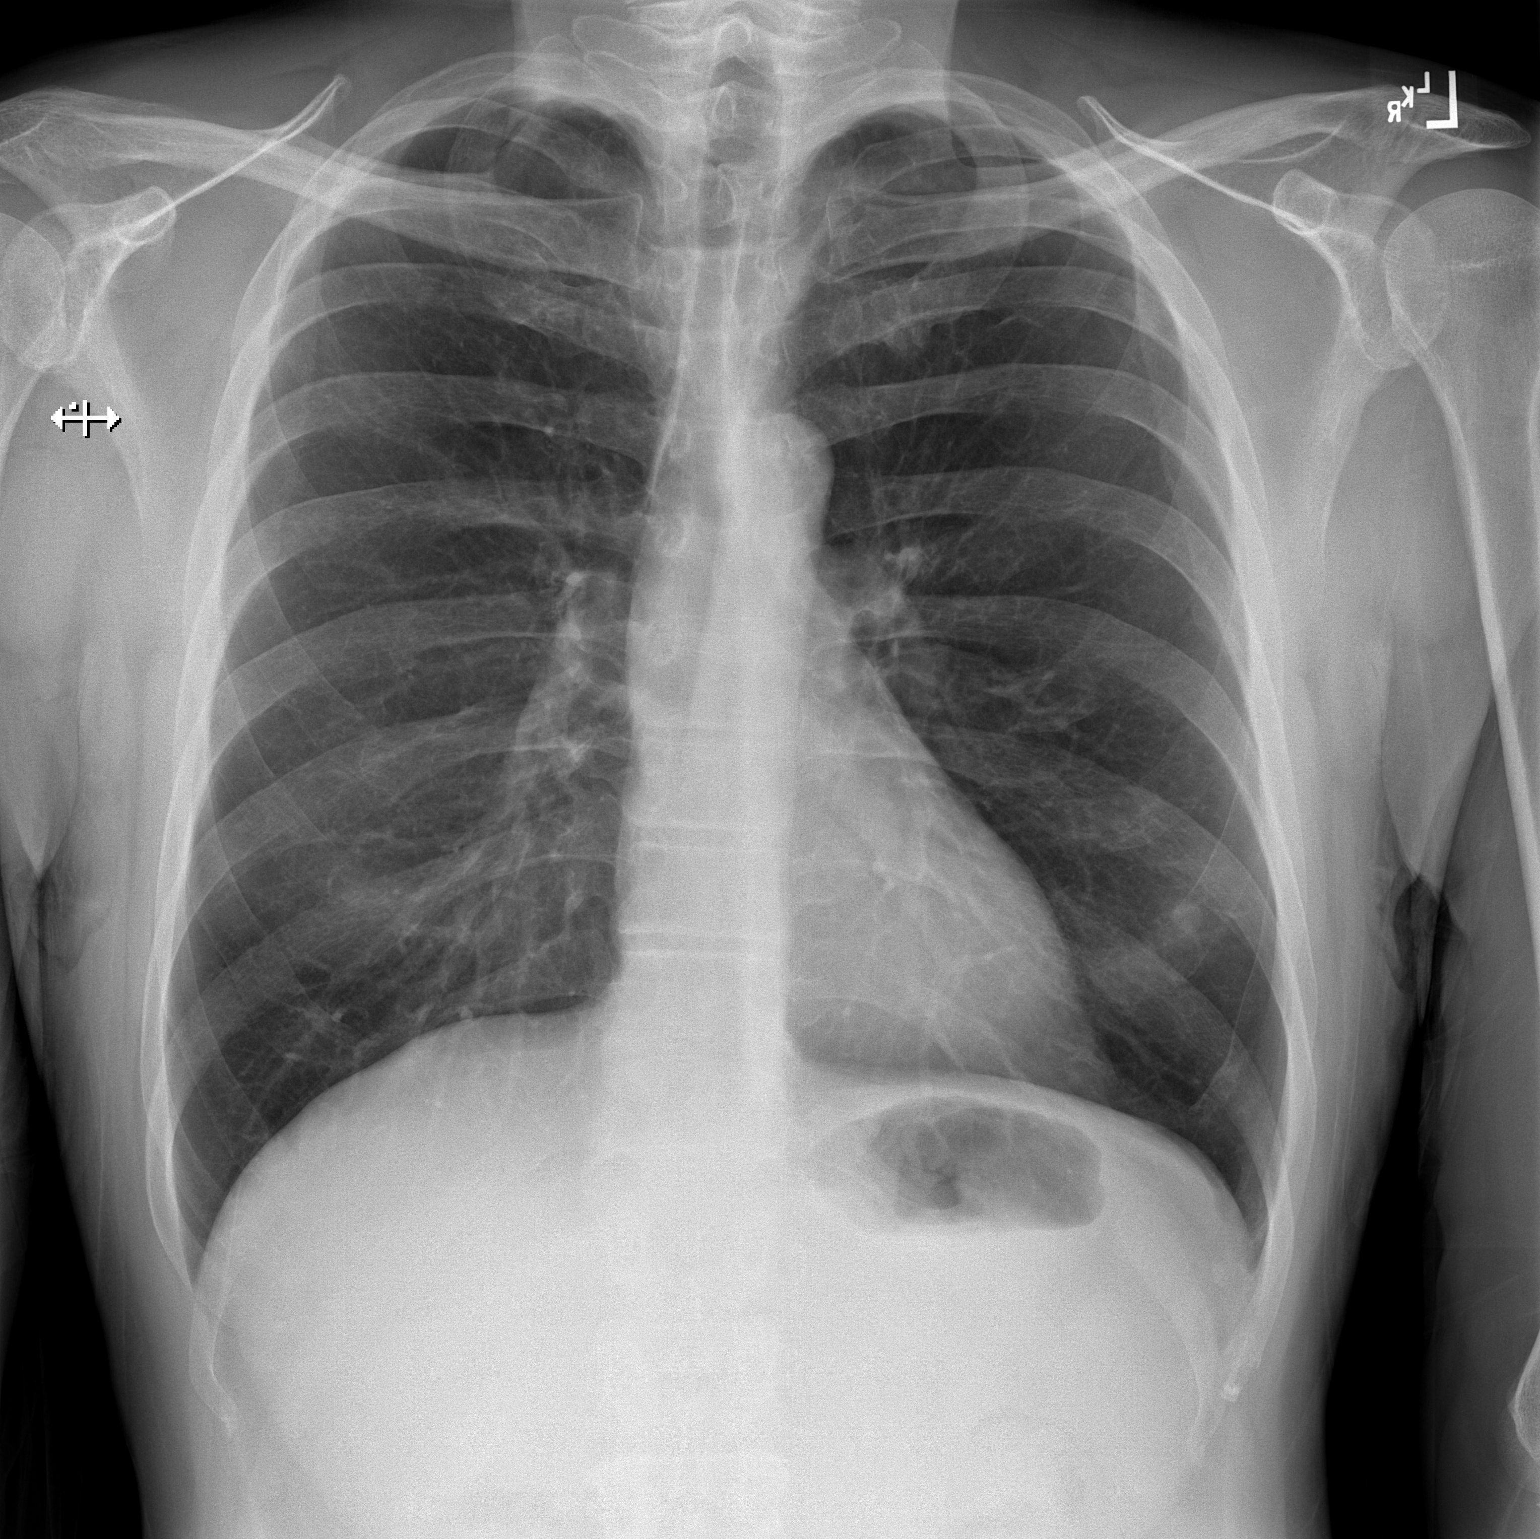
[im 2/2]
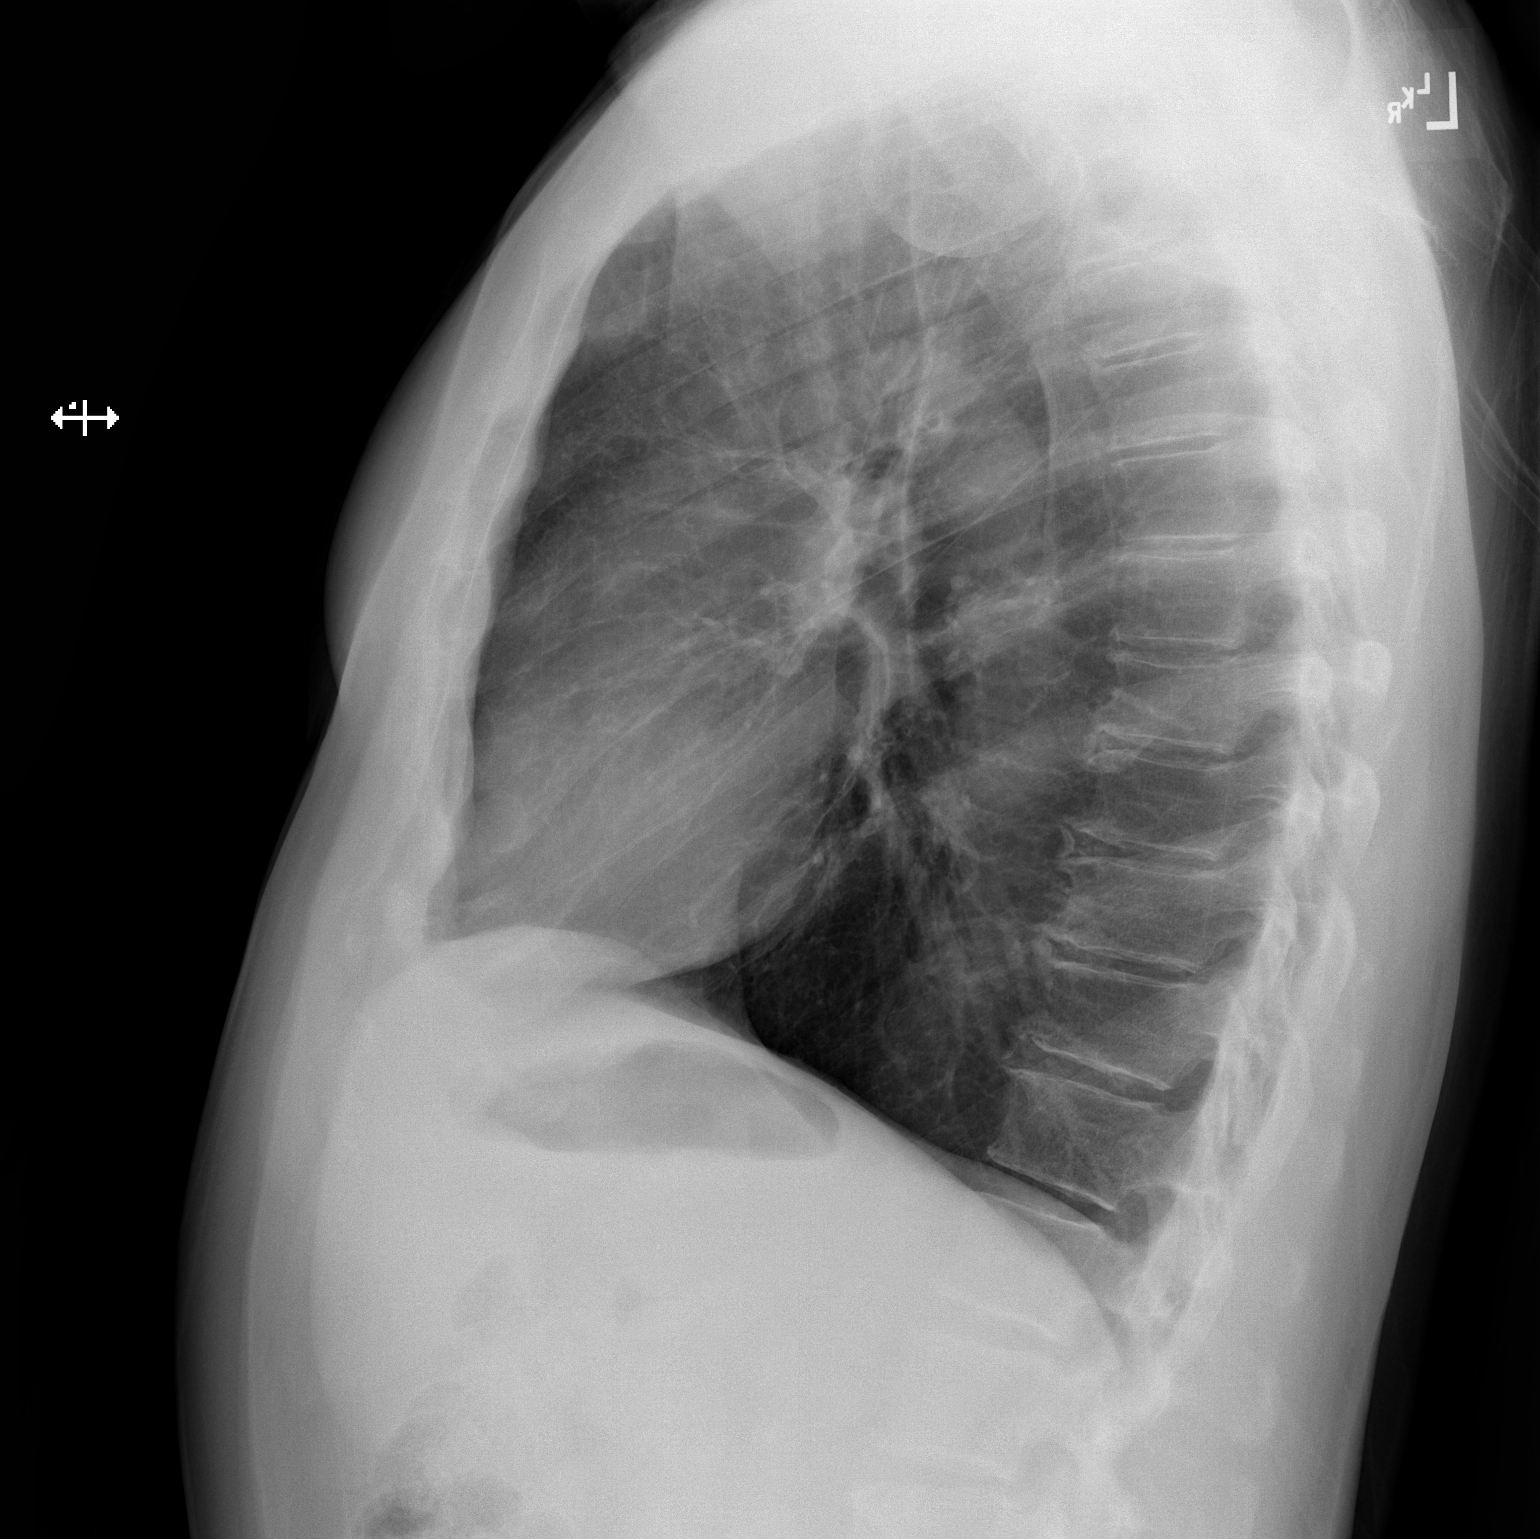

[2 of 2 positions shown; findings below may reference images not displayed]

FINDINGS: The heart size and mediastinal contours are normal. Asymmetric 7 mm
nodular density projects over the anterior aspect of the left sixth
rib on the frontal examination. This is not clearly seen on the
lateral view. Based on small size and conspicuity, this is favored
to reflect an asymmetric nipple shadow, granuloma or bone island.
The lungs are otherwise clear. There is no pleural effusion. Mild
thoracic spine degenerative changes are present.
IMPRESSION: No acute findings. Suspected incidental nodular density projecting
over the left lung base, not likely to be clinically significant.
Repeat frontal examination with nipple markers and lordotic
positioning may be helpful for more definitive characterization.

These results will be called to the ordering clinician or
representative by the Radiologist Assistant, and communication
documented in the PACS or zVision Dashboard.

## 2017-10-05 IMAGING — CT CT CHEST W/ CM
2 of 3 series · 15 of 36 positions shown, 18 images · IV contrast (Omni 300)
Comparison: No previous chest radiograph is provided for
comparison.

CLINICAL DATA: Followup pulmonary nodule seen on chest radiography.

EXAM:
CT CHEST WITH CONTRAST
TECHNIQUE: Multidetector CT imaging of the chest was performed during
intravenous contrast administration.
CONTRAST:  75mL [id number]-RWW IOPAMIDOL (ICEZ5I-RWW) INJECTION 61%

[Series 2: chest with 2mm st · axial · 0.71mm/px · z∈[-642,-320]mm · 12 of 190 slices shown, 15 images]
[im 15/190  mediastinal]
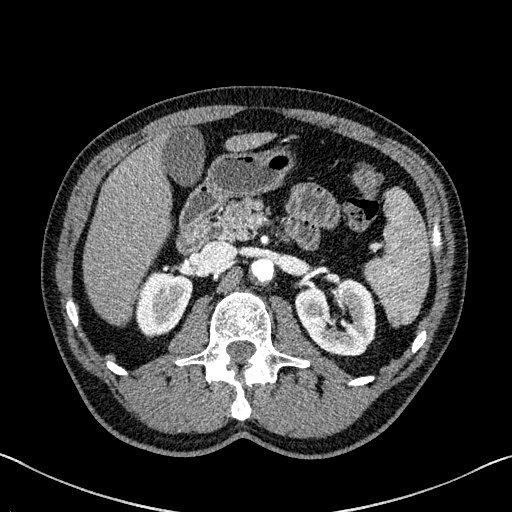
[im 15/190  lung]
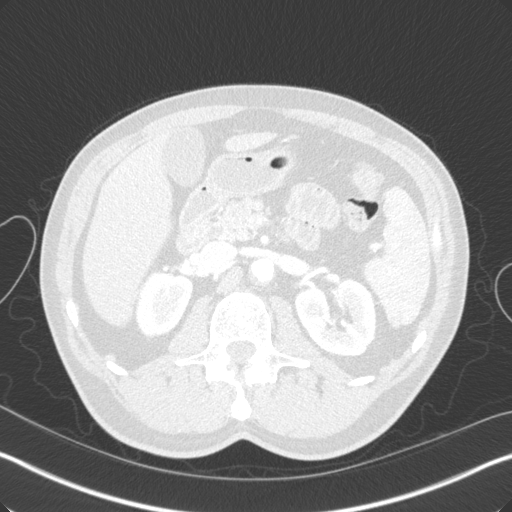
[im 29/190  lung]
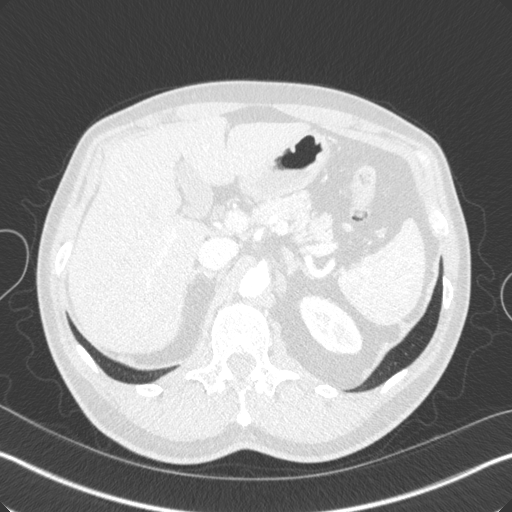
[im 43/190  lung]
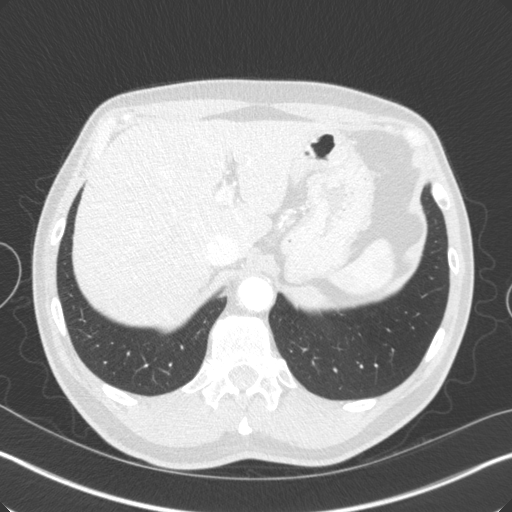
[im 57/190  lung]
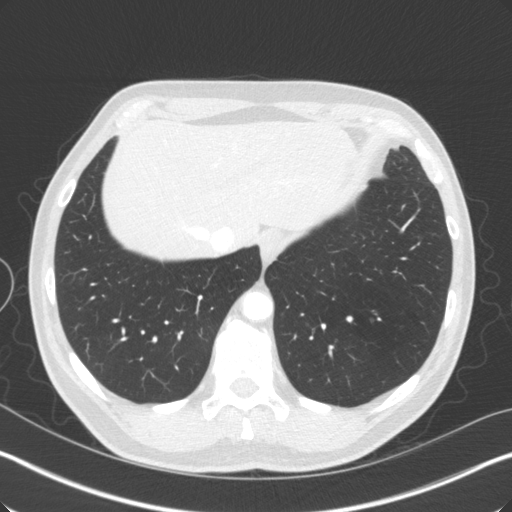
[im 71/190  mediastinal]
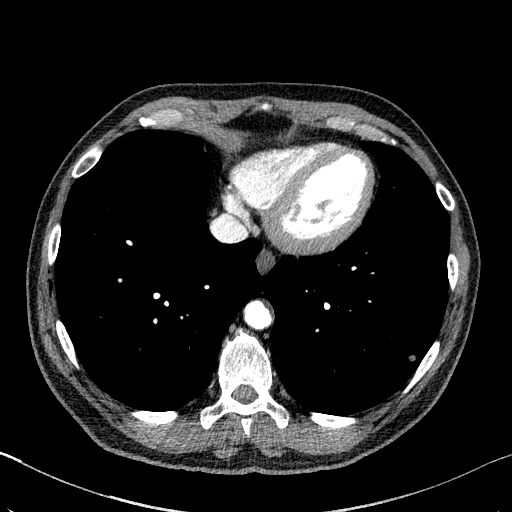
[im 71/190  lung]
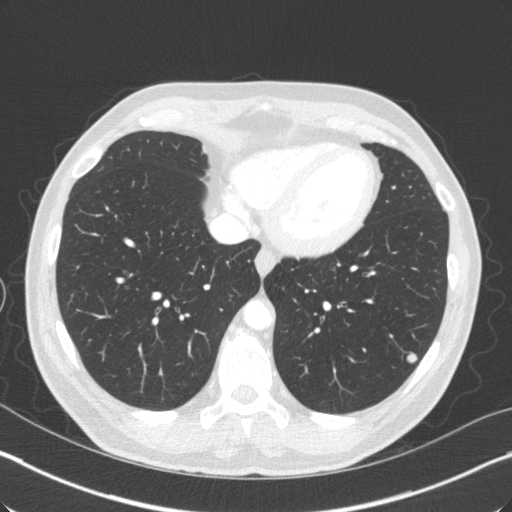
[im 85/190  lung]
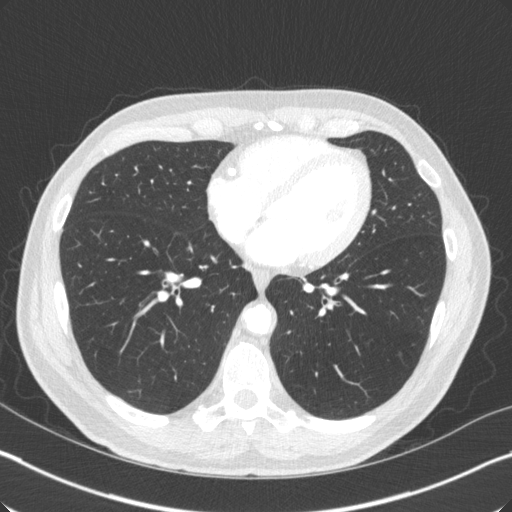
[im 106/190  lung]
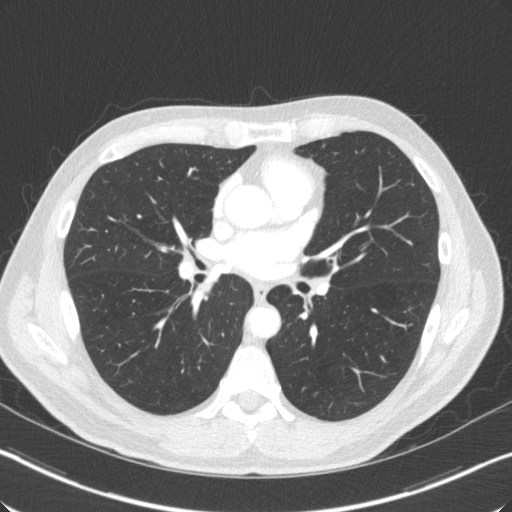
[im 120/190  lung]
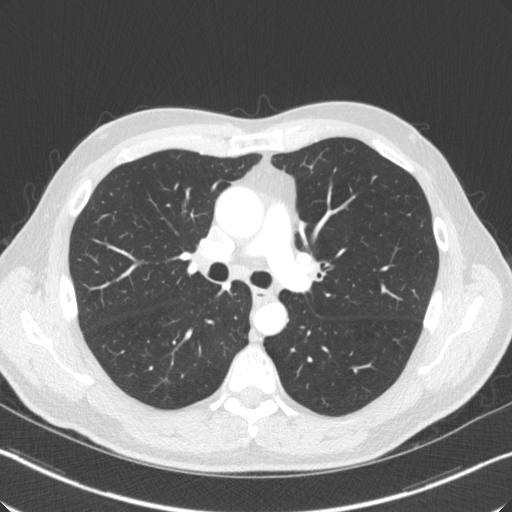
[im 134/190  mediastinal]
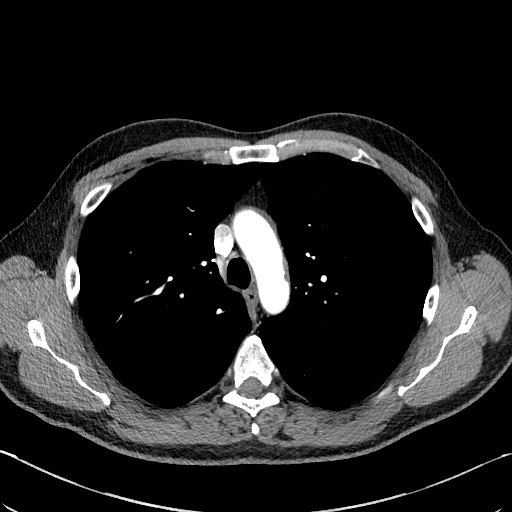
[im 134/190  lung]
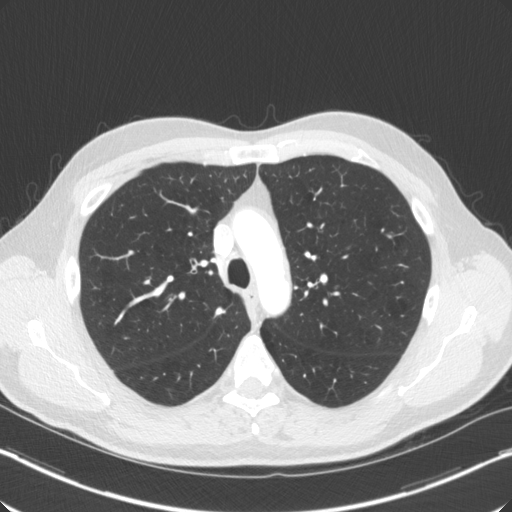
[im 148/190  lung]
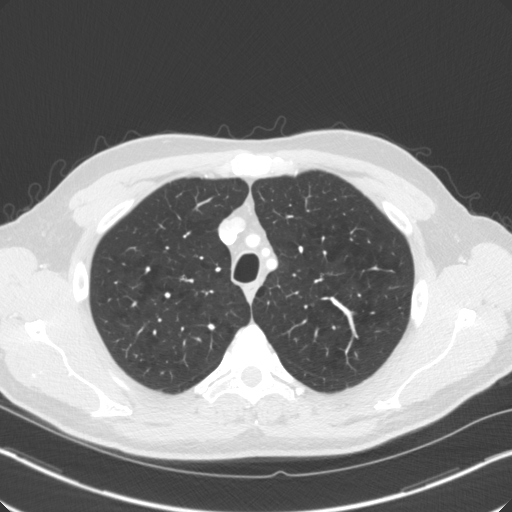
[im 162/190  lung]
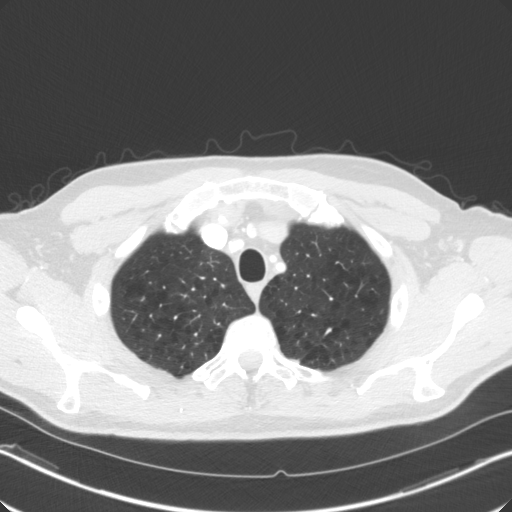
[im 176/190  lung]
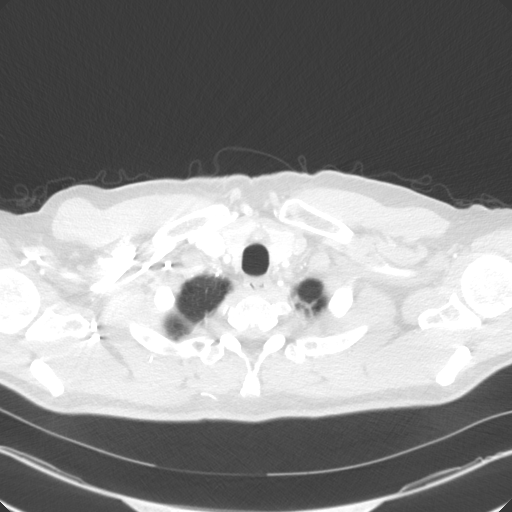

[Series 4: chest with 3mm st cor · coronal · 0.71mm/px · 3 of 86 slices shown]
[im 18/86  lung]
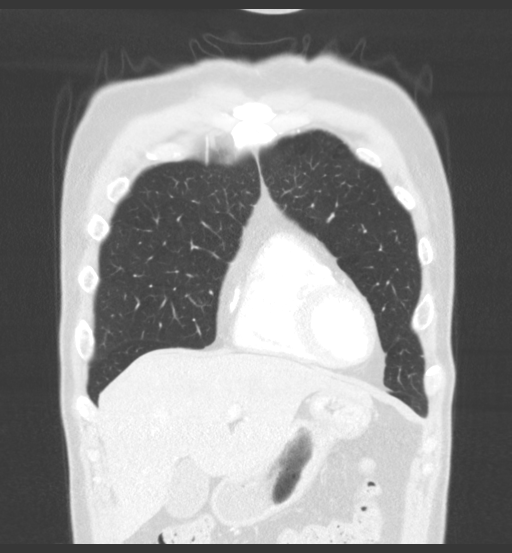
[im 35/86  lung]
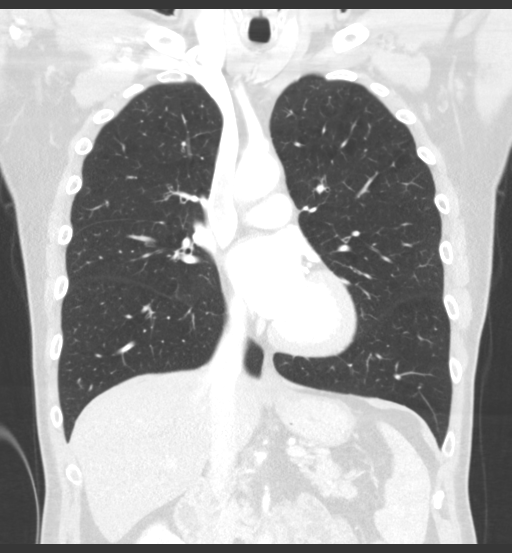
[im 52/86  lung]
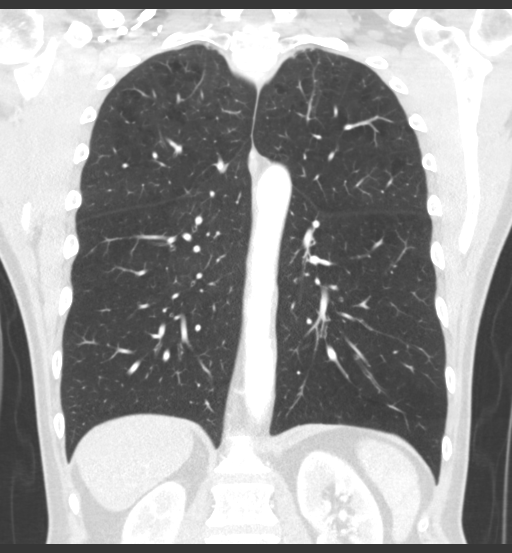

[15 of 36 positions shown; findings below may reference images not displayed]

FINDINGS: Cardiovascular: Heart size is normal. No pericardial fluid.
Extensive coronary artery calcification is noted in the left system.
Right coronary artery stent in place. The aortic arch shows
extensive irregular atherosclerosis. No aneurysm or dissection in
the chest.

Mediastinum/Nodes: No hilar or mediastinal mass or lymphadenopathy.

Lungs/Pleura: There is emphysema affecting the upper lungs. There is
mild pleural and parenchymal scarring at both apices. There is a
very well-circumscribed ovoid nodule peripherally in the left lower
lobe image 119 measuring 6.7 x 8.3 mm. The edges appear smooth. No
second focal finding.

Upper Abdomen: Negative except for advanced atherosclerotic disease
of the aorta and its branch vessels. Maximal diameter on the lowest
image is 28 mm.

Musculoskeletal: Ordinary spinal degenerative change.
IMPRESSION: Well-circumscribed ovoid 6.7 x 8.3 mm nodule left lower lobe image
119. Non-contrast chest CT at 6-12 months is recommended. If the
nodule is stable at time of repeat CT, then future CT at 18-24
months (from today's scan) is considered optional for low-risk
patients, but is recommended for high-risk patients. This
recommendation follows the consensus statement: Guidelines for
Management of Incidental Pulmonary Nodules Detected on CT Images:

Background pattern of upper lobe predominant emphysema.

Advanced atherosclerotic vascular disease including coronary artery
disease and advanced aortic plaque with irregularity. Aneurysmal
dilatation of the infrarenal abdominal aorta partially demonstrated,
maximal diameter 2.8 cm on the lowest image. Complete abdominal
aortic evaluation recommended, as the diameter is likely larger
below that.

## 2017-10-29 ENCOUNTER — Other Ambulatory Visit: Payer: Self-pay

## 2017-10-29 ENCOUNTER — Telehealth: Payer: Self-pay

## 2017-10-29 MED ORDER — CLOPIDOGREL BISULFATE 75 MG PO TABS: 75.0000 mg | ORAL_TABLET | Freq: Every day | ORAL | 0 refills | Status: DC

## 2017-10-29 NOTE — Telephone Encounter (Signed)
clopidogrel (PLAVIX) 75 MG tablet 90 tablet 0 10/29/2017    Sig - Route: Take 1 tablet (75 mg total) by mouth daily. - Oral   Sent to pharmacy as: clopidogrel (PLAVIX) 75 MG tablet   E-Prescribing Status: Receipt confirmed by pharmacy (10/29/2017 10:19 AM EDT)   Pharmacy   [id number] [address] [address]- [place], [place] - [address]

## 2017-11-10 ENCOUNTER — Ambulatory Visit: Payer: BLUE CROSS/BLUE SHIELD | Admitting: Internal Medicine

## 2017-12-08 ENCOUNTER — Other Ambulatory Visit: Payer: Self-pay | Admitting: Internal Medicine

## 2018-01-28 ENCOUNTER — Other Ambulatory Visit: Payer: Self-pay | Admitting: Internal Medicine

## 2018-02-03 ENCOUNTER — Telehealth: Payer: Self-pay | Admitting: Internal Medicine

## 2018-02-03 NOTE — Telephone Encounter (Signed)
-----   Message from Paul Koch, [place] sent at 01/31/2018  8:59 AM EDT ----- Please contact patient for an appointment requesting medication refill.

## 2018-02-03 NOTE — Telephone Encounter (Signed)
Attempted to call patient to schedule appointment No answer no voicemail   Will try again at later time

## 2018-02-08 NOTE — Telephone Encounter (Signed)
Patient is scheduled now to come in the morning  02/09/18

## 2018-02-08 NOTE — Progress Notes (Signed)
Follow-up Outpatient Visit Date: 02/09/2018  Primary Care Provider: Gilles Chiquito, MD [address] [place] [address]  Chief Complaint: Follow-up coronary artery disease  HPI:  Paul Koch is a 56 y.o. year-old male with history of coronary artery disease status post PCI to the mid RCA (01/2016), hypertension, and anxiety, who presents for follow-up of coronary artery disease.  I last saw him about a year ago, at which time he was doing well other than mild fatigue compared to 5 or 6 years ago.  Today, Paul Koch reports that he has been doing relatively well.  He has noted some fatigue and occasional lightheadedness, especially when he reaches above his head.  His wife has also noted that he seems to sleep a lot.  Paul Koch denies chest pain, shortness of breath, palpitations, orthopnea, PND, and edema.  He bruises easily, remaining on aspirin and clopidogrel.  He has not had any significant bleeding.  He is due for annual labs next month through his PCP.  --------------------------------------------------------------------------------------------------  Cardiovascular History & Procedures: Cardiovascular Problems:  Coronary artery disease s/p PCI (01/2016)  Risk Factors:  Known CAD, HTN, prior tobacco use, male gender, and age > 66  Cath/PCI:  LHC/PCI (01/30/16): Mild LMCA, LAD, and LCx disease. 90% mid RCA stenosis. Successful PCI to mid RCA with Promus Premier 3.5 x 38 mm DES. Normal LVEF (55-65%).  CV Surgery:  None  EP Procedures and Devices:  None  Non-Invasive Evaluation(s):  Abdominal aortic ultrasound (03/19/16): Atherosclerotic plaquing of the abdominal aorta noted, which is upper normal in size, measuring up to 2.9 cm in diameter.  Exercise myocardial perfusion stress test (01/23/16): Moderate size, moderate in severitypartially reversiblebasal inferoseptal, mid inferoseptal, mid inferior, and apical inferior defect consistent with ischemiaand  possible scar. Treadmill demonstrates 2 mm ST depression in the inferior leads. LVEF greater at 65%.Exercise myocardial perfusion stress test (01/23/16): I have personally reviewed the images. Moderate size, moderate in severitypartially reversiblebasal inferoseptal, mid inferoseptal, mid inferior, and apical inferior defect consistent with ischemiaand possible scar. Treadmill demonstrates 2 mm ST depression in the inferior leads. LVEF greater at 65%.  Recent CV Pertinent Labs: Lab Results  Component Value Date   CHOL 162 02/05/2016   HDL 29 (L) 02/05/2016   LDLCALC 76 02/05/2016   TRIG 287 (H) 02/05/2016   CHOLHDL 5.6 (H) 02/05/2016   CHOLHDL 5 07/31/2008   INR 1.02 01/28/2016   K 4.9 02/05/2016   BUN 17 02/05/2016   CREATININE 0.94 02/05/2016    Past medical and surgical history were reviewed and updated in EPIC.  Current Meds  Medication Sig  . amLODipine (NORVASC) 5 MG tablet Take 5 mg by mouth 2 (two) times daily.  Marland Kitchen aspirin EC 81 MG tablet Take 1 tablet (81 mg total) by mouth daily.  Marland Kitchen atorvastatin (LIPITOR) 20 MG tablet TAKE 1 TABLET BY MOUTH DAILY AT 6 PM  . Camphor-Menthol-Methyl Sal (TIGER BALM MUSCLE RUB EX) Apply 1 application topically daily as needed (knee pain).  . clonazePAM (KLONOPIN) 1 MG tablet Take 1 mg by mouth 2 (two) times daily.  . clopidogrel (PLAVIX) 75 MG tablet TAKE 1 TABLET BY MOUTH EVERY DAY  . FLUoxetine (PROZAC) 20 MG tablet Take 20 mg daily by mouth.  . loratadine (CLARITIN) 10 MG tablet Take 10 mg by mouth daily as needed for allergies.   . metoprolol tartrate (LOPRESSOR) 50 MG tablet Take 1 tablet (50 mg total) 2 (two) times daily by mouth.  . nitroGLYCERIN (NITROSTAT) 0.4  MG SL tablet Place 1 tablet (0.4 mg total) every 5 (five) minutes as needed under the tongue for chest pain.  . ramipril (ALTACE) 10 MG capsule Take 10 mg by mouth 2 (two) times daily.   Marland Kitchen zolpidem (AMBIEN) 10 MG tablet Take 10 mg by mouth at bedtime as needed for sleep.      Allergies: Clindamycin hcl  Social History   Tobacco Use  . Smoking status: Former Smoker    Packs/day: 1.00    Years: 20.00    Pack years: 20.00    Types: Cigarettes    Last attempt to quit: 2004    Years since quitting: 15.8  . Smokeless tobacco: Never Used  Substance Use Topics  . Alcohol use: No    Comment: social (5 drinks/year)  . Drug use: No    Family History  Problem Relation Age of Onset  . Brain cancer Mother   . Lung cancer Mother   . Arrhythmia Mother   . Hypertension Mother   . Hyperlipidemia Mother   . Bladder Cancer Mother   . Stroke Mother   . Emphysema Father     Review of Systems: A 12-system review of systems was performed and was negative except as noted in the HPI.  --------------------------------------------------------------------------------------------------  Physical Exam: BP 126/64 (BP Location: Left Arm, Patient Position: Sitting, Cuff Size: Normal)   Pulse (!) 50   Ht 5\' 9"  (1.753 m)   Wt 172 lb 8 oz (78.2 kg)   BMI 25.47 kg/m   General:  NAD. HEENT: No conjunctival pallor or scleral icterus. Moist mucous membranes.  OP clear. Neck: Supple without lymphadenopathy, thyromegaly, JVD, or HJR.  Lungs: Normal work of breathing. Clear to auscultation bilaterally without wheezes or crackles. Heart: Bradycardic but regular without murmurs, rubs, or gallops. Non-displaced PMI. Abd: Bowel sounds present. Soft, NT/ND without hepatosplenomegaly Ext: No lower extremity edema. Radial, PT, and DP pulses are 2+ bilaterally. Skin: Warm and dry without rash.  EKG: Sinus bradycardia (heart rate 50 bpm) with incomplete RBBB, which may be related to lead placement.  Otherwise, no significant change from prior tracing on 02/10/2017.  Lab Results  Component Value Date   WBC 8.8 01/28/2016   HGB 15.8 01/28/2016   HCT 45.8 01/28/2016   MCV 85.6 01/28/2016   PLT 204 01/28/2016    Lab Results  Component Value Date   NA 135 02/05/2016   K 4.9  02/05/2016   CL 95 (L) 02/05/2016   CO2 26 02/05/2016   BUN 17 02/05/2016   CREATININE 0.94 02/05/2016   GLUCOSE 78 02/05/2016   ALT 19 02/05/2016    Lab Results  Component Value Date   CHOL 162 02/05/2016   HDL 29 (L) 02/05/2016   LDLCALC 76 02/05/2016   TRIG 287 (H) 02/05/2016   CHOLHDL 5.6 (H) 02/05/2016    --------------------------------------------------------------------------------------------------  ASSESSMENT AND PLAN: Coronary artery disease No symptoms to suggest worsening coronary insufficiency.  We have discussed the risks and benefits of indefinite dual antiplatelet therapy and have agreed to discontinue clopidogrel given bruising.  Continue aggressive secondary prevention, including statin therapy.  Sinus bradycardia Likely due to beta-blockade.  This may be contributing to some of his fatigue and lightheadedness.  We will cut metoprolol in half to 25 mg twice daily.  If symptoms persist, it may be useful to check a TSH.  I will defer this to his PCP.  Hypertension Blood pressure normal today.  We will decrease metoprolol to 25 mg twice daily.  I will not change any other medications today.  Hyperlipidemia LDL at goal on last check a year ago.  Mr. roller is due for repeat labs through his PCP next month.  We will continue high intensity statin therapy pending these results.  Solitary lung nodule No symptoms.  Nodule was stable on last CT over a year ago.  Follow-up CT was previously ordered but never scheduled.  We will make arrangements for follow-up CT at his earliest convenience.  Influenza immunization Seasonal flu vaccine administered today at the patient's request.  Follow-up: Return to clinic in 1 year.  Yvonne Kendall, MD 02/09/2018 9:10 AM

## 2018-02-09 ENCOUNTER — Encounter: Payer: Self-pay | Admitting: Internal Medicine

## 2018-02-09 ENCOUNTER — Ambulatory Visit (INDEPENDENT_AMBULATORY_CARE_PROVIDER_SITE_OTHER): Payer: BLUE CROSS/BLUE SHIELD | Admitting: Internal Medicine

## 2018-02-09 VITALS — BP 126/64 | HR 50 | Ht 69.0 in | Wt 172.5 lb

## 2018-02-09 DIAGNOSIS — I2510 Atherosclerotic heart disease of native coronary artery without angina pectoris: Secondary | ICD-10-CM

## 2018-02-09 DIAGNOSIS — I10 Essential (primary) hypertension: Secondary | ICD-10-CM | POA: Diagnosis not present

## 2018-02-09 DIAGNOSIS — R001 Bradycardia, unspecified: Secondary | ICD-10-CM | POA: Diagnosis not present

## 2018-02-09 DIAGNOSIS — Z23 Encounter for immunization: Secondary | ICD-10-CM | POA: Diagnosis not present

## 2018-02-09 DIAGNOSIS — R911 Solitary pulmonary nodule: Secondary | ICD-10-CM

## 2018-02-09 DIAGNOSIS — E785 Hyperlipidemia, unspecified: Secondary | ICD-10-CM

## 2018-02-09 MED ORDER — METOPROLOL TARTRATE 25 MG PO TABS: 25.0000 mg | ORAL_TABLET | Freq: Two times a day (BID) | ORAL | 3 refills | Status: DC

## 2018-02-09 NOTE — Patient Instructions (Addendum)
Medication Instructions:  Your physician has recommended you make the following change in your medication:  1- STOP Plavix. 2- DECREASE Metoprolol tartrate to 25 mg by mouth two times a day.  If you need a refill on your cardiac medications before your next appointment, please call your pharmacy.   Lab work: Your physician recommends that you return for lab work WITHIN 30 DAYS OF THE CT. Verify with employer that they are able to draw a BMET or CMET. If not, we may need you to get lab work at [employer].  If you have labs (blood work) drawn today and your tests are completely normal, you will receive your results only by: Marland Kitchen MyChart Message (if you have MyChart) OR . A paper copy in the mail If you have any lab test that is abnormal or we need to change your treatment, we will call you to review the results.  Testing/Procedures: Non-Cardiac CT scanning of the CHEST WITHOUT CONTRAST, (CAT scanning), is a noninvasive, special x-ray that produces cross-sectional images of the body using x-rays and a computer. CT scans help physicians diagnose and treat medical conditions. For some CT exams, a contrast material is used to enhance visibility in the area of the body being studied. CT scans provide greater clarity and reveal more details than regular x-ray exams.  Please call [phone] to schedule at your convenience. Please make sure to to tell them you would like it done at [facility].  Follow-Up: At [facility], you and your health needs are our priority.  As part of our continuing mission to provide you with exceptional heart care, we have created designated Provider Care Teams.  These Care Teams include your primary Cardiologist (physician) and Advanced Practice Providers (APPs -  Physician Assistants and Nurse Practitioners) who all work together to provide you with the care you need, when you need it. You will need a follow up appointment in 12 months.  Please call our office 2 months  in advance to schedule this appointment.  You may see DR Cristal Deer END or one of the following Advanced Practice Providers on your designated Care Team:   Nicolasa Ducking, NP Eula Listen, PA-C . Marisue Ivan, PA-C

## 2018-03-14 ENCOUNTER — Telehealth: Payer: Self-pay | Admitting: Internal Medicine

## 2018-03-14 DIAGNOSIS — R918 Other nonspecific abnormal finding of lung field: Secondary | ICD-10-CM

## 2018-03-14 DIAGNOSIS — I10 Essential (primary) hypertension: Secondary | ICD-10-CM

## 2018-03-14 DIAGNOSIS — Z01810 Encounter for preprocedural cardiovascular examination: Secondary | ICD-10-CM

## 2018-03-14 NOTE — Telephone Encounter (Signed)
Patient wants lab orders sent to [facility] [place] that were ordered at recent ov (cmet )

## 2018-03-14 NOTE — Telephone Encounter (Signed)
Attempted to reach patient. One number rang several times with no answer or voicemail. Other number rang several times and no answer or voicemail.

## 2018-03-14 NOTE — Telephone Encounter (Signed)
Fax per patient [phone]

## 2018-03-15 NOTE — Telephone Encounter (Signed)
Called patient. He is having lab work drawn at his work [employer] tomorrow. We need him to have a BMET prior to his chest CT. Order for BMET faxed to the number patient provided.

## 2018-03-17 NOTE — Telephone Encounter (Signed)
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 From December 2019. Routing to Dr End to review.

## 2018-03-17 NOTE — Telephone Encounter (Signed)
Results scanned to Epic

## 2018-03-18 NOTE — Telephone Encounter (Signed)
LDL at goal.  Liver function, renal function, and blood counts normal.  Continue current medications for secondary prevention of CAD.  Yvonne Kendallhristopher Briggette Najarian, MD [facility] Pager: [phone]

## 2018-03-21 NOTE — Telephone Encounter (Signed)
MyChart message with Dr Serita KyleEnd's recommendations sent to patient.

## 2018-03-23 NOTE — Telephone Encounter (Signed)
Patient calling  States that he has attempted to schedule CT but the order from Dr End has expired Will need a new order placed  Please advise or call to discuss

## 2018-03-24 NOTE — Addendum Note (Signed)
Addended by: Stann MainlandLARK, Angie Hogg O on: 03/24/2018 08:56 AM   Modules accepted: Orders

## 2018-03-24 NOTE — Telephone Encounter (Signed)
CT chest without contrast order entered.  No answer and no VM.  MyChart Message sent to patient.

## 2018-04-12 ENCOUNTER — Ambulatory Visit
Admission: RE | Admit: 2018-04-12 | Discharge: 2018-04-12 | Disposition: A | Discharge status: Home / Self Care | Payer: BLUE CROSS/BLUE SHIELD | Source: Ambulatory Visit | Attending: Internal Medicine | Admitting: Internal Medicine

## 2018-04-12 DIAGNOSIS — I10 Essential (primary) hypertension: Secondary | ICD-10-CM | POA: Diagnosis present

## 2018-04-12 DIAGNOSIS — R918 Other nonspecific abnormal finding of lung field: Secondary | ICD-10-CM | POA: Diagnosis present

## 2018-04-12 DIAGNOSIS — Z01810 Encounter for preprocedural cardiovascular examination: Secondary | ICD-10-CM

## 2018-04-14 ENCOUNTER — Telehealth: Payer: Self-pay | Admitting: *Deleted

## 2018-04-14 DIAGNOSIS — E041 Nontoxic single thyroid nodule: Secondary | ICD-10-CM

## 2018-04-14 DIAGNOSIS — R918 Other nonspecific abnormal finding of lung field: Secondary | ICD-10-CM

## 2018-04-14 NOTE — Telephone Encounter (Signed)
-----   Message from Yvonne Kendall, MD sent at 04/12/2018  9:36 AM EST ----- Please let Paul Koch know that his chest CT shows stable size of the lung nodule.  A small nodule was also noted on the left thyroid.  I recommend obtaining a thyroid ultrasound at his convenience and repeating a chest CT without contrast in 1 year.

## 2018-04-14 NOTE — Telephone Encounter (Signed)
First number was no answer with message to please try your call again later.  Second number rang several times with no answer.  Will try again at a later time.   Orders for Repeat CT chest in 1 year entered and thyroid ultrasound entered.

## 2018-04-21 NOTE — Telephone Encounter (Signed)
Results called to pt. Pt verbalized understanding. He is agreeable to thyroid ultrasound.   Called scheduling and scheduled for 05/02/2018 at 8am, arrival at 07:45 am at the [facility]   He verbalized understanding of appointment date and arrival time.

## 2018-05-02 ENCOUNTER — Ambulatory Visit
Admission: RE | Admit: 2018-05-02 | Discharge: 2018-05-02 | Disposition: A | Discharge status: Home / Self Care | Payer: BLUE CROSS/BLUE SHIELD | Source: Ambulatory Visit | Attending: Internal Medicine | Admitting: Internal Medicine

## 2018-05-02 DIAGNOSIS — E041 Nontoxic single thyroid nodule: Secondary | ICD-10-CM | POA: Insufficient documentation

## 2018-05-09 ENCOUNTER — Telehealth: Payer: Self-pay | Admitting: *Deleted

## 2018-05-09 NOTE — Telephone Encounter (Signed)
Patient verbalized understanding of the results. He would like to think about the referral and hold off at this time. He is going to discuss this with his wife and call us back with his decision.

## 2018-05-09 NOTE — Telephone Encounter (Signed)
Phone rang several times and no Vm picked up on number listed as home. Mobile number had not answer and no VM set up.

## 2018-05-09 NOTE — Telephone Encounter (Signed)
Patient returning call.

## 2018-05-09 NOTE — Telephone Encounter (Signed)
-----   Message from Yvonne Kendall, MD sent at 05/09/2018  7:02 AM EST ----- Please let Paul Koch know that ultrasound his thyroid showed several nodules, most of which are small and benign-appearing.  One nodule is somewhat larger and may require further evaluation.  Please refer him to an endocrinologist for further work-up.

## 2018-09-02 ENCOUNTER — Other Ambulatory Visit: Payer: Self-pay

## 2018-09-02 ENCOUNTER — Telehealth: Payer: Self-pay | Admitting: Internal Medicine

## 2018-09-02 MED ORDER — ATORVASTATIN CALCIUM 20 MG PO TABS: ORAL_TABLET | ORAL | 3 refills | Status: DC

## 2018-09-02 NOTE — Telephone Encounter (Signed)
atorvastatin (LIPITOR) 20 MG tablet 90 tablet 3 09/02/2018    Sig: TAKE 1 TABLET BY MOUTH DAILY AT 6 PM   Sent to pharmacy as: atorvastatin (LIPITOR) 20 MG tablet   E-Prescribing Status: Receipt confirmed by pharmacy (09/02/2018 8:45 AM EDT)   Pharmacy   [id number] [place], [place] - [address]

## 2018-09-02 NOTE — Telephone Encounter (Signed)
°*  STAT* If patient is at the pharmacy, call can be transferred to refill team.   1. Which medications need to be refilled? (please list name of each medication and dose if known)     Atorvastatin 20 mg po qhs   2. Which pharmacy/location (including street and city if local pharmacy) is medication to be sent to? [facility]   3. Do they need a 30 day or 90 day supply? 90

## 2018-09-20 ENCOUNTER — Other Ambulatory Visit: Payer: Self-pay

## 2018-09-20 MED ORDER — CLONAZEPAM 1 MG PO TABS: 1.0000 mg | ORAL_TABLET | Freq: Two times a day (BID) | ORAL | 2 refills | Status: DC

## 2018-09-21 ENCOUNTER — Other Ambulatory Visit: Payer: Self-pay

## 2018-09-21 MED ORDER — RAMIPRIL 10 MG PO CAPS: 10.0000 mg | ORAL_CAPSULE | Freq: Two times a day (BID) | ORAL | 3 refills | Status: DC

## 2018-10-06 ENCOUNTER — Other Ambulatory Visit: Payer: Self-pay

## 2018-10-06 MED ORDER — AMLODIPINE BESYLATE 5 MG PO TABS: 5.0000 mg | ORAL_TABLET | Freq: Two times a day (BID) | ORAL | 3 refills | Status: DC

## 2018-11-04 ENCOUNTER — Other Ambulatory Visit: Payer: Self-pay

## 2018-11-04 MED ORDER — FLUOXETINE HCL 20 MG PO TABS: 60.0000 mg | ORAL_TABLET | Freq: Every day | ORAL | 0 refills | Status: DC

## 2018-11-04 NOTE — Telephone Encounter (Signed)
Pharm sent refill request, per chart last refilled on 08/17/17 #270 with 3 refills. Pt has not been seen since 08/17/17, never seen in office by Dr Roxan Hockey. No up coming appts.

## 2018-11-04 NOTE — Telephone Encounter (Signed)
Will refill for one month, and patient needs f/u for any extended refill with noting he has not been seen in over a year, and not by me per your message. Please contact him to notify.

## 2018-11-15 ENCOUNTER — Other Ambulatory Visit: Payer: Self-pay | Admitting: Internal Medicine

## 2018-11-16 NOTE — Telephone Encounter (Signed)
I have made an appt for 11/17/2018

## 2018-11-17 ENCOUNTER — Encounter: Payer: Self-pay | Admitting: Internal Medicine

## 2018-11-17 ENCOUNTER — Ambulatory Visit: Payer: Managed Care, Other (non HMO) | Admitting: Internal Medicine

## 2018-11-17 ENCOUNTER — Other Ambulatory Visit: Payer: Self-pay

## 2018-11-17 VITALS — BP 119/69 | HR 51 | Temp 97.3°F | Resp 16 | Ht 69.0 in | Wt 160.0 lb

## 2018-11-17 DIAGNOSIS — R911 Solitary pulmonary nodule: Secondary | ICD-10-CM

## 2018-11-17 DIAGNOSIS — G4726 Circadian rhythm sleep disorder, shift work type: Secondary | ICD-10-CM

## 2018-11-17 DIAGNOSIS — F411 Generalized anxiety disorder: Secondary | ICD-10-CM

## 2018-11-17 DIAGNOSIS — E041 Nontoxic single thyroid nodule: Secondary | ICD-10-CM | POA: Insufficient documentation

## 2018-11-17 DIAGNOSIS — I10 Essential (primary) hypertension: Secondary | ICD-10-CM

## 2018-11-17 DIAGNOSIS — I2510 Atherosclerotic heart disease of native coronary artery without angina pectoris: Secondary | ICD-10-CM

## 2018-11-17 DIAGNOSIS — E785 Hyperlipidemia, unspecified: Secondary | ICD-10-CM

## 2018-11-17 DIAGNOSIS — I700 Atherosclerosis of aorta: Secondary | ICD-10-CM

## 2018-11-17 NOTE — Progress Notes (Signed)
Paul Koch - Patient is a 58 y.o. white male with a h/o CAD, HTN, hyperlipidemia followed for a lung nodule, and anxiety which has been treated to date with fluoxetine and f/u'Paul Koch today as due for refills of his fluoxetine.  All in all, he has been feeling well and without complaints. He notes the klonipin has been the most helpful med for him and when started, took the real gnawing feeling away and has been very helpful. The prozac also helps. 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 He tries to not use if not needed and when does, does not drive during the day after taking noted.   He sees cardiology yearly, last visit 02/2018 and they ordered a repeat CT scan for the pulm nodule. Done 04/2018 - stable 8 mm pulm nodule and one more f/u CT rec'ed in a year. They also found a thyroid nodule and f/u U/Paul Koch done end of Jan 2020 and the larger nodule on U/Paul Koch met cirteria for fine needle aspiration bx and he was notified by cardiology of this and he wanted to hold off on that procedure and has not yet proceded to get this done. He wants to do a f/u imaging again over time and see if it has changed before the bx is done.  He has been trying his best with Covid precautions. No recent fevers or concerning Covid concerning sx'Paul Koch.  Denies any recent CP'Paul Koch, notes he is careful when gets up and goes as at times his HR feels up some if gets going too quickly and his metoprolol was decreased to 25 mg last visit with cards (noted he feels a little better with that decrease). No increased LE edema (noted socks can cause a little indentation at times), energy levels remain stable, no orthopnea/PND.  Allergies  Allergen Reactions  . Clindamycin Hcl     REACTION: stomach upset   Current Outpatient Medications on File Prior to Visit  Medication Sig Dispense Refill  . amLODipine (NORVASC) 5 MG tablet Take 1 tablet (5 mg  total) by mouth 2 (two) times daily. 180 tablet 3  . aspirin EC 81 MG tablet Take 1 tablet (81 mg total) by mouth daily. 30 tablet 0  . atorvastatin (LIPITOR) 20 MG tablet TAKE 1 TABLET BY MOUTH DAILY AT 6 PM 90 tablet 3  . clonazePAM (KLONOPIN) 1 MG tablet Take 1 tablet (1 mg total) by mouth 2 (two) times daily. 60 tablet 2  . FLUoxetine (PROZAC) 20 MG tablet Take 3 tablets (60 mg total) by mouth daily. 90 tablet 0  . loratadine (CLARITIN) 10 MG tablet Take 10 mg by mouth daily as needed for allergies.   3  . metoprolol tartrate (LOPRESSOR) 25 MG tablet Take 1 tablet (25 mg total) by mouth 2 (two) times daily. 180 tablet 3  . ramipril (ALTACE) 10 MG capsule Take 1 capsule (10 mg total) by mouth 2 (two) times daily. 180 capsule 3  . zolpidem (AMBIEN) 10 MG tablet Take 10 mg by mouth at bedtime as needed for sleep.   5   No current facility-administered medications on file prior to visit.    Tob - former smoker (quit 2004)    O- NAD, masked  BP 119/69 (BP Location: Right Arm, Patient Position: Sitting, Cuff Size: Large)   Pulse (!) 51   Temp (!) 97.3 F (36.3 C) (  Oral)   Resp 16   Ht 5' 9" (1.753 m)   Wt 160 lb (72.6 kg)   SpO2 95%   BMI 23.63 kg/m   HEENT - sclera anicteric Car - RRR without m Pulm - CTA Abd - soft, NT in UQ'Paul Koch Ext - trace LE edema (mild sock indentation with low socks wearing) Affect not flat, approp with conversation, speech not rapid and talks in a very relaxed manner   Last labs doen 03/2018 reviewed - glc - 87, LDL - 57, remainder normal   Ass/Plan: 1. Anxiety - generalized anxiety disorder   Educated on why daily medications best (help lower generalized anxiety level and lessen height of peaks to better control symptoms), and continue the klonipin presently, not to increase at all on his own mentioned and not take at the same time as [place] rec'ed Cont fluoxetine presently - tolerating well and feel is helpful  2. HTN - BP controlled, may have mild  orthostasis noted and agree with care when getting up and about as doing presently helpful  Not make any further changes to meds today and monitor and cont with cards f/u Did note if more sx'ic in near future, may want to f/u sooner than end of this year as planned  3. CAD hx - cardiology following (had PCI to mid RCA 01/2016)  4. Lung nodule - stable on last check as above noted, with one more CT rec'ed to ensure stability and planned  5. Thyroid nodule - found on most recent f/u CT  Fine needle aspiration bx rec'ed and noted that to the patient today. Noted the only sure way to make sure benign is by getting the bx and he noted wanted to get a f/u imaging over time before proceeding. I did note the bx is not complicated and done in the office usually by endocrine and if he decides to proceed with this, can help arrange and let us know.  6. AAA - cards following, has been stable noted in recent past  7. Insomnia - discussed my concerns with the med Ambien, especially in combination with the Klonipin and do feel is reasonable how he is using with the change from working nights and then days a struggle and this helpful to more quickly reset the sleep cycles. Will continue as doing and encouraged to use as little of the ambien as possible, trying to cut in half if can to use a lower dose and assess, and not take ever with Klonipin at the same time.  8. Hyperlipidemia - on statin and at LDL at goal (<70 last check)  F/u as planned with cards (rec'ed one year after early November 2019 f/u),  with annual labs due again in December and f/u here again after the labs are obtained

## 2018-11-22 ENCOUNTER — Other Ambulatory Visit: Payer: Self-pay

## 2018-11-22 ENCOUNTER — Other Ambulatory Visit: Payer: Self-pay | Admitting: Cardiovascular Disease

## 2018-11-22 MED ORDER — FLUOXETINE HCL 20 MG PO CAPS: ORAL_CAPSULE | ORAL | 3 refills | Status: DC

## 2018-11-22 NOTE — Telephone Encounter (Signed)
Please review for refill.  

## 2018-11-22 NOTE — Telephone Encounter (Signed)
Plavix was discontinued November 2019. Refill refused.

## 2018-11-23 ENCOUNTER — Telehealth: Payer: Self-pay

## 2018-11-23 NOTE — Telephone Encounter (Signed)
Spoke with [place] at [facility] [phone]) about prior authorization for medication.  Rec'd [facility] that "prior authorization required due to plan limitations max qty 45 in 75 days". Rec'd approval for 1 tab/day for 3 years (11/23/2018 - 11/22/2021). States pharmacy will reprocess refill request.  AMD

## 2018-11-25 ENCOUNTER — Telehealth: Payer: Self-pay

## 2018-11-25 NOTE — Telephone Encounter (Signed)
Paul Koch (Pharmacist) [phone] - Informed that PA approved on 11/23/2018 for Zolpidem Tartrate 10 mg tablets 1 tab per day for 30 days and good for 3 years.  AMD

## 2018-12-17 ENCOUNTER — Other Ambulatory Visit: Payer: Self-pay | Admitting: Internal Medicine

## 2018-12-22 ENCOUNTER — Other Ambulatory Visit: Payer: Self-pay

## 2018-12-22 MED ORDER — FLUOXETINE HCL 20 MG PO CAPS: ORAL_CAPSULE | ORAL | 3 refills | Status: DC

## 2019-01-11 ENCOUNTER — Ambulatory Visit: Payer: Managed Care, Other (non HMO)

## 2019-01-11 ENCOUNTER — Other Ambulatory Visit: Payer: Self-pay

## 2019-01-11 DIAGNOSIS — Z23 Encounter for immunization: Secondary | ICD-10-CM

## 2019-01-17 ENCOUNTER — Other Ambulatory Visit: Payer: Self-pay | Admitting: Cardiovascular Disease

## 2019-02-01 ENCOUNTER — Other Ambulatory Visit: Payer: Self-pay

## 2019-02-01 DIAGNOSIS — G4700 Insomnia, unspecified: Secondary | ICD-10-CM

## 2019-02-01 MED ORDER — ZOLPIDEM TARTRATE 10 MG PO TABS: 10.0000 mg | ORAL_TABLET | Freq: Every evening | ORAL | 0 refills | Status: DC | PRN

## 2019-02-01 NOTE — Telephone Encounter (Signed)
Last appt 11/17/2018 with Dr Roxan Hockey.  Using ambien prn shift change nights to days every 2 weeks not every night.  Electronic Rx for #30 RF0 sent to his pharmacy of choice as this should be 60 day supply.  Reviewed [place] PMP website all Rx from [facility] providers.  Last ambien fill 11/25/2018.

## 2019-03-20 ENCOUNTER — Other Ambulatory Visit: Payer: Self-pay | Admitting: Internal Medicine

## 2019-03-20 ENCOUNTER — Telehealth: Payer: Self-pay

## 2019-03-20 MED ORDER — METOPROLOL TARTRATE 25 MG PO TABS: 25.0000 mg | ORAL_TABLET | Freq: Two times a day (BID) | ORAL | 0 refills | Status: DC

## 2019-03-20 NOTE — Telephone Encounter (Signed)
Requested Prescriptions   Signed Prescriptions Disp Refills  . metoprolol tartrate (LOPRESSOR) 25 MG tablet 60 tablet 0    Sig: Take 1 tablet (25 mg total) by mouth 2 (two) times daily. Please call [phone] to schedule appointment for further refills. Thank you!    Authorizing Provider: END, CHRISTOPHER    Ordering User: Raelene Bott, Alesi Zachery L

## 2019-03-21 ENCOUNTER — Other Ambulatory Visit: Payer: Self-pay | Admitting: Occupational Medicine

## 2019-03-21 ENCOUNTER — Telehealth: Payer: Self-pay | Admitting: Internal Medicine

## 2019-03-21 MED ORDER — CLONAZEPAM 1 MG PO TABS: 1.0000 mg | ORAL_TABLET | Freq: Two times a day (BID) | ORAL | 0 refills | Status: DC | PRN

## 2019-03-21 NOTE — Telephone Encounter (Signed)
Medicine sent in 03/20/2019  metoprolol tartrate (LOPRESSOR) 25 MG tablet 60 tablet 0 03/20/2019 06/18/2019   Sig - Route: Take 1 tablet (25 mg total) by mouth 2 (two) times daily. Please call [phone] to schedule appointment for further refills. Thank you! - Oral   Sent to pharmacy as: metoprolol tartrate (LOPRESSOR) 25 MG tablet   E-Prescribing Status: Receipt confirmed by pharmacy (03/20/2019  1:36 PM EST)   Ph[facility]acy  [id number] - [place], [place] - [address]

## 2019-03-21 NOTE — Telephone Encounter (Signed)
New Message      *STAT* If patient is at the pharmacy, call can be transferred to refill team.   1. Which medications need to be refilled? (please list name of each medication and dose if known) metoprolol tartrate (LOPRESSOR) 25 MG tablet  2. Which pharmacy/location (including street and city if local pharmacy) is medication to be sent to?[id number] [place], [place] - [address]  3. Do they need a 30 day or 90 day supply? [place]

## 2019-03-22 ENCOUNTER — Telehealth: Payer: Self-pay | Admitting: Registered Nurse

## 2019-03-22 NOTE — Telephone Encounter (Signed)
error 

## 2019-03-23 ENCOUNTER — Encounter: Payer: Self-pay | Admitting: Physician Assistant

## 2019-03-23 ENCOUNTER — Other Ambulatory Visit: Payer: Self-pay

## 2019-03-23 ENCOUNTER — Ambulatory Visit: Payer: Managed Care, Other (non HMO) | Admitting: Physician Assistant

## 2019-03-23 VITALS — BP 120/70 | HR 60 | Temp 98.7°F | Resp 16 | Ht 69.5 in | Wt 160.0 lb

## 2019-03-23 DIAGNOSIS — F411 Generalized anxiety disorder: Secondary | ICD-10-CM

## 2019-03-23 MED ORDER — CLONAZEPAM 1 MG PO TABS: 1.0000 mg | ORAL_TABLET | Freq: Two times a day (BID) | ORAL | 1 refills | Status: DC

## 2019-03-23 NOTE — Progress Notes (Addendum)
Mr Kendrick has general anxiety issues and insomnia.  For years has been followed at [facility]. One maintenance medication is  Klonopin 1.0 mg BID with generally very good control-- but it is important to him to have both tablets daily for that  control.  His recent refill was not processed in a timely manner and to cover the interim few days an abbreviated Rx for 10 tablets/days was written by Dr MR Geoffry Paradise.  He presents to the office today for face to face contact and meds review. He is relaxed and interactive, appropriately animated, a good historian and has good recall of current medications, dosages and schedules. He is aware that months with 31 days can sometimes be difficult for him but he starts early and fractures 1-2 pills to bridge the extra day  I will order a new Rx for Klonopin 1.0 # 60  1 BID w 1 refill   to get him back on schedule: [facility] [address] [place] at [employer] with pharmacist [phone]   Rx was received   We also discussed coordination of medications through a counseling office near his home in [place]. He will discuss it with his wife and with [place] during his return visit a month from now.

## 2019-03-27 ENCOUNTER — Other Ambulatory Visit: Payer: Self-pay | Admitting: Registered Nurse

## 2019-03-27 DIAGNOSIS — G4700 Insomnia, unspecified: Secondary | ICD-10-CM

## 2019-03-29 ENCOUNTER — Encounter: Payer: Self-pay | Admitting: Registered Nurse

## 2019-03-29 NOTE — Telephone Encounter (Signed)
Last seen by PA Truman Hayward on 03/23/2019  Reviewed PMP Website all Rx by [facility] providers in previous 2 years.  Last ambien fill 02/01/2019 #30.  Last labs 03/2018 due annual fasting labs and next face to face visit for controlled substances due Mar 2021.  Previously appt with Dr Roxan Hockey 11/17/2018 using Lorrin Mais prn 30 tabs should be 60 day supply.  Per paper chart review patient has been on ambien since 03/2009.

## 2019-04-13 ENCOUNTER — Other Ambulatory Visit: Payer: Self-pay | Admitting: Internal Medicine

## 2019-04-26 ENCOUNTER — Other Ambulatory Visit: Payer: Self-pay

## 2019-04-26 DIAGNOSIS — J302 Other seasonal allergic rhinitis: Secondary | ICD-10-CM

## 2019-04-26 MED ORDER — LORATADINE 10 MG PO TABS: 10.0000 mg | ORAL_TABLET | Freq: Every day | ORAL | 1 refills | Status: DC | PRN

## 2019-05-05 ENCOUNTER — Other Ambulatory Visit: Payer: Self-pay

## 2019-05-05 ENCOUNTER — Ambulatory Visit: Payer: Managed Care, Other (non HMO)

## 2019-05-05 DIAGNOSIS — Z0000 Encounter for general adult medical examination without abnormal findings: Secondary | ICD-10-CM

## 2019-05-05 LAB — POCT URINALYSIS DIPSTICK
Bilirubin, UA: NEGATIVE
Blood, UA: NEGATIVE
Glucose, UA: NEGATIVE
Ketones, UA: NEGATIVE
Leukocytes, UA: NEGATIVE
Nitrite, UA: NEGATIVE
Protein, UA: POSITIVE — AB
Spec Grav, UA: 1.025 (ref 1.010–1.025)
Urobilinogen, UA: 0.2 E.U./dL
pH, UA: 6 (ref 5.0–8.0)

## 2019-05-05 NOTE — Progress Notes (Signed)
Scheduled to complete physical 05/15/2019 with Durward Parcel, PA-C (Interim Provider).  AMD

## 2019-05-06 LAB — CMP12+LP+TP+TSH+6AC+PSA+CBC?
Bilirubin Total: 0.4 mg/dL (ref 0.0–1.2)
GFR calc Af Amer: 103 mL/min/{1.73_m2} (ref >59)
LDH: 164 IU/L (ref 121–224)
MCH: 28.5 pg (ref 26.6–33.0)
RDW: 13.5 % (ref 11.6–15.4)
T3 Uptake Ratio: 28 % (ref 24–39)
Uric Acid: 5 mg/dL (ref 3.8–8.4)
VLDL Cholesterol Cal: 17 mg/dL (ref 5–40)

## 2019-05-06 LAB — CMP12+LP+TP+TSH+6AC+PSA+CBC…
ALT: 20 IU/L (ref 0–44)
AST: 20 IU/L (ref 0–40)
Albumin/Globulin Ratio: 1.9 (ref 1.2–2.2)
Albumin: 4.3 g/dL (ref 3.8–4.9)
Alkaline Phosphatase: 75 IU/L (ref 39–117)
BUN/Creatinine Ratio: 14 (ref 10–24)
BUN: 13 mg/dL (ref 8–27)
Basophils Absolute: 0 10*3/uL (ref 0.0–0.2)
Basos: 1 %
Calcium: 8.8 mg/dL (ref 8.6–10.2)
Chloride: 100 mmol/L (ref 96–106)
Chol/HDL Ratio: 3.3 ratio (ref 0.0–5.0)
Cholesterol, Total: 111 mg/dL (ref 100–199)
Creatinine, Ser: 0.93 mg/dL (ref 0.76–1.27)
EOS (ABSOLUTE): 0.1 10*3/uL (ref 0.0–0.4)
Eos: 2 %
Estimated CHD Risk: <0.5 times avg. (ref 0.0–1.0)
Free Thyroxine Index: 2.2 (ref 1.2–4.9)
GFR calc non Af Amer: 89 mL/min/{1.73_m2} (ref >59)
GGT: 15 IU/L (ref 0–65)
Globulin, Total: 2.3 g/dL (ref 1.5–4.5)
Glucose: 88 mg/dL (ref 65–99)
HDL: 34 mg/dL — ABNORMAL LOW (ref >39)
Hematocrit: 44.8 % (ref 37.5–51.0)
Hemoglobin: 14.7 g/dL (ref 13.0–17.7)
Immature Grans (Abs): 0 10*3/uL (ref 0.0–0.1)
Immature Granulocytes: 0 %
Iron: 76 ug/dL (ref 38–169)
LDL Chol Calc (NIH): 60 mg/dL (ref 0–99)
Lymphocytes Absolute: 2.4 10*3/uL (ref 0.7–3.1)
Lymphs: 31 %
MCHC: 32.8 g/dL (ref 31.5–35.7)
MCV: 87 fL (ref 79–97)
Monocytes Absolute: 0.4 10*3/uL (ref 0.1–0.9)
Monocytes: 5 %
Neutrophils Absolute: 4.6 10*3/uL (ref 1.4–7.0)
Neutrophils: 61 %
Phosphorus: 3.9 mg/dL (ref 2.8–4.1)
Platelets: 238 10*3/uL (ref 150–450)
Potassium: 5 mmol/L (ref 3.5–5.2)
Prostate Specific Ag, Serum: 1.8 ng/mL (ref 0.0–4.0)
RBC: 5.15 x10E6/uL (ref 4.14–5.80)
Sodium: 139 mmol/L (ref 134–144)
T4, Total: 7.9 ug/dL (ref 4.5–12.0)
TSH: 0.795 u[IU]/mL (ref 0.450–4.500)
Total Protein: 6.6 g/dL (ref 6.0–8.5)
Triglycerides: 89 mg/dL (ref 0–149)
WBC: 7.6 10*3/uL (ref 3.4–10.8)

## 2019-05-09 ENCOUNTER — Other Ambulatory Visit: Payer: Self-pay | Admitting: *Deleted

## 2019-05-09 MED ORDER — METOPROLOL TARTRATE 25 MG PO TABS: ORAL_TABLET | ORAL | 0 refills | Status: DC

## 2019-05-15 ENCOUNTER — Ambulatory Visit: Payer: Managed Care, Other (non HMO) | Admitting: Physician Assistant

## 2019-05-15 ENCOUNTER — Other Ambulatory Visit: Payer: Self-pay

## 2019-05-15 VITALS — BP 122/70 | HR 64 | Temp 98.0°F | Ht 69.0 in | Wt 166.4 lb

## 2019-05-15 DIAGNOSIS — Z0000 Encounter for general adult medical examination without abnormal findings: Secondary | ICD-10-CM

## 2019-05-15 DIAGNOSIS — G4700 Insomnia, unspecified: Secondary | ICD-10-CM

## 2019-05-15 MED ORDER — ZOLPIDEM TARTRATE 10 MG PO TABS: 10.0000 mg | ORAL_TABLET | Freq: Every evening | ORAL | 1 refills | Status: DC | PRN

## 2019-05-15 NOTE — Progress Notes (Signed)
   Subjective: Routine annual physical    Patient ID: Paul Koch, male    DOB: 1960/10/14, 59 y.o.   MRN: [id number]  HPI Patient presents for routine physical was concerned only for erectile dysfunction.   Review of Systems Hypertension, hyperlipidemia, insomnia, and erectile dysfunction.    Objective:   Physical Exam No acute distress.  HEENT is unremarkable.  Neck is supple without adenopathy or bruits.  Lungs are clear to auscultation. Heart regular rate and rhythm.  Abdomen with negative HSM.  Normoactive bowel sounds.  Soft nontender palpation.  No cervical or lumbar deformity.  Patient has full equal range of motion cervical lumbar thoracic spine.  No obvious deformity to the lower or upper extremities.  Patient full equal range of motion of the lower and upper extremities.  Cranial nerves II through XII are grossly intact.       Assessment & Plan: Routine annual exam  Discussed lab results with patient.  Advised continue previous medication.  Deferred erectile dysfunction at this time.

## 2019-05-21 ENCOUNTER — Telehealth: Payer: Self-pay | Admitting: Internal Medicine

## 2019-05-22 NOTE — Telephone Encounter (Signed)
No vm available to leave messages

## 2019-05-22 NOTE — Telephone Encounter (Signed)
Patient needs an appointment for further refills. If patient does not want to schedule an appointment please make them aware to contact PCP for refills. I have sent in enough medication until appointment can be made.   Thanks Ladies!  

## 2019-05-23 ENCOUNTER — Telehealth: Payer: Self-pay | Admitting: Internal Medicine

## 2019-05-23 NOTE — Telephone Encounter (Signed)
Patient has been contacted at least 3 times for a recall, recall has been deleted  

## 2019-05-30 ENCOUNTER — Other Ambulatory Visit: Payer: Self-pay

## 2019-05-30 DIAGNOSIS — F411 Generalized anxiety disorder: Secondary | ICD-10-CM

## 2019-05-30 MED ORDER — CLONAZEPAM 1 MG PO TABS: 1.0000 mg | ORAL_TABLET | Freq: Two times a day (BID) | ORAL | 0 refills | Status: DC | PRN

## 2019-05-30 NOTE — Telephone Encounter (Signed)
Unable to reach patient.

## 2019-05-30 NOTE — Telephone Encounter (Signed)
[place] PMP website reviewed last filled 04/26/2019 by PA Nedra Hai.  Labs 05/05/2019 renal and liver function normal  Last face to face visit PA [facility] 05/15/2019.  VSS Electronic Rx sent to his pharmacy of choice klonopin 1mg  po BID #60 RF0 next face to face due May 2021.

## 2019-05-31 NOTE — Telephone Encounter (Signed)
Attempted to schedule no ans no vm  

## 2019-05-31 NOTE — Telephone Encounter (Signed)
Unable to contact via phone x 3  closing encounter

## 2019-06-06 NOTE — Telephone Encounter (Signed)
Patient called back scheduled 3/8   Please send in metoprolol

## 2019-06-06 NOTE — Telephone Encounter (Signed)
Please see note below. 

## 2019-06-07 ENCOUNTER — Other Ambulatory Visit: Payer: Self-pay

## 2019-06-07 ENCOUNTER — Other Ambulatory Visit: Payer: Self-pay | Admitting: Internal Medicine

## 2019-06-07 MED ORDER — METOPROLOL TARTRATE 25 MG PO TABS: ORAL_TABLET | ORAL | 0 refills | Status: DC

## 2019-06-07 NOTE — Telephone Encounter (Signed)
metoprolol tartrate (LOPRESSOR) 25 MG tablet 180 tablet 0 06/07/2019    Sig: TAKE 1 TABLET BY MOUTH TWICE A DAY Must keep upcoming appointment for further refills.   Sent to pharmacy as: metoprolol tartrate (LOPRESSOR) 25 MG tablet   E-Prescribing Status: Sent to pharmacy (06/07/2019  1:13 PM EST)

## 2019-06-08 NOTE — Progress Notes (Signed)
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   Past Medical History    Past Medical History:  Diagnosis Date  . AAA (abdominal aortic aneurysm) (HCC)   . Anxiety disorder   . CAD (coronary artery disease)   . COPD (chronic obstructive pulmonary disease) (HCC)   . Eczema   . ED (erectile dysfunction)   . Elevated lipids   . Hyperplastic colon polyp 2014   due 2024  . Hypertension   . Lung nodule    Past Surgical History:  Procedure Laterality Date  . CARDIAC CATHETERIZATION N/A 01/30/2016   Procedure: Left Heart Cath and Coronary Angiography;  Surgeon: Corky Crafts, MD;  Location: [facility] INVASIVE CV LAB;  Service: Cardiovascular;  Laterality: N/A;  . CARDIAC CATHETERIZATION N/A 01/30/2016   Procedure: Coronary Stent Intervention;  Surgeon: Corky Crafts, MD;  Location: [facility] INVASIVE CV LAB;  Service: Cardiovascular;  Laterality: N/A;  . TONSILLECTOMY AND ADENOIDECTOMY    . VASECTOMY      Allergies  Allergies  Allergen Reactions  . Clindamycin Hcl     REACTION: stomach upset    History of Present Illness    59 yo male with history of CAD.  He underwent MPI 01/23/2016 that showed a moderate size, moderate in severity, and partially reversible basal inferior septal, mid inferior septal, mid inferior, and apical inferior defect consistent with ischemia and possible scar.  Treadmill demonstrated 2 mm ST depressions in the inferior leads.  LVEF greater at 65%.  He then underwent LHC and PCI 01/30/2016. At that time, it was noted he had mild LMCA, LAD, and LCx dz.  He had 90% mRCA stenosis with successful PCI to [facility] with DES. LVEF 55-65%.  He underwent an abdominal aortic ultrasound 03/19/2016 that showed atherosclerotic plaque of the abdominal aorta at the upper limits in size, measuring 2.9 cm in diameter with most recent US showing 3.1cm diameter 05/2017.  He was last seen in the office 02/09/2018 by his primary cardiologist.  At that time, he had no symptoms to suggest worsening coronary insufficiency. He noted some fatigue and lightheadedness, especially when reaching over his head.  His Plavix was stopped and he was continued on secondary prevention, including statin therapy.  His rate was bradycardic, likely due to beta-blocker.  It was thought that this might be contributing to some of his fatigue and lightheadedness; therefore, metoprolol was cut in half to 25 mg twice daily.  It was noted that, if this symptoms persisted, it might be useful to check a TSH.  He had a solitary lung nodule that was noted to be stable on CT over a year ago.  A follow-up CT was ordered but never scheduled; therefore, arrangements for follow-up CT were made and showed stable 12mm pulmonary nodule and thyroid nodule with recommendation for fine needle aspiration bx. Per review of 11/17/18 MD notes, it appears that, at that time, he preferred to hold off on that procedure and rather do f/u imaging over time to see if there was change before the bx was performed.  Since last seen in the office, Mr. Justine Null reports that he has been doing well from a cardiac standpoint.  He denies any symptoms consistent with angina.  He does report some dyspnea if exerting himself to an extreme amount that quickly resolves.  He denies any racing heart rate or palpitations.  He is monitoring his blood pressure at home with SBP 120s and DBP 70s.  He and his wife have been very satisfied with his blood pressure on his amlodipine, as well as his current lab work.  He is trying to eat healthy, and his wife stays on  top of his salt intake.  He does report ongoing fatigue.  No abdominal distention, orthopnea, PND, early satiety, or SOB at rest. He does have some LEE on exam today, which does not bother him. Weight consistent with that of his 05/2019 weight. He admits that he does not always get consistent amount of sleep, often waking to urinate every 3 hours.  He continues to work and notes that sometimes his calves will feel sore after he has walked up almost 80 feet or 4 flights of stairs but denies any other symptoms of lower extremity claudication.  We discussed the symptoms of PAD with his understanding.  He denies any symptoms of presyncope.  He does admit that he was getting some orthostatic symptoms when not eating breakfast that have since resolved with eating oatmeal at night or first thing in the morning.  He denies any signs or symptoms of bleeding.  He reports medication compliance.  He is doing well on the reduced dose BB. He does not have a regular workout routine but does admit that he stays fairly active, most recently removing a 30 foot branch from on top of his garage/shed.  His thyroid nodule and pulmonary nodules were discussed with recommendations as below.  Most recent labs were discussed with recommendations as below.  Home Medications    Prior to Admission medications   Medication Sig Start Date End Date Taking? Authorizing Provider  amLODipine (NORVASC) 5 MG tablet Take 1 tablet (5 mg total) by mouth 2 (two) times daily. 10/06/18   Towanda Malkin, MD  aspirin EC 81 MG tablet Take 1 tablet (81 mg total) by mouth daily. 01/30/16   Arbutus Leas, NP  atorvastatin (LIPITOR) 20 MG tablet TAKE 1 TABLET BY MOUTH DAILY AT 6 PM 09/02/18   End, Harrell Gave, MD  clonazePAM (KLONOPIN) 1 MG tablet Take 1 tablet (1 mg total) by mouth 2 (two) times daily as needed for anxiety. 05/30/19   Betancourt, Aura Fey, NP  FLUoxetine (PROZAC) 20 MG capsule Take three capsules daily as directed 12/22/18    Towanda Malkin, MD  FLUoxetine (PROZAC) 20 MG tablet Take 3 tablets by mouth daily. 12/27/18   [provider]  loratadine (CLARITIN) 10 MG tablet Take 1 tablet (10 mg total) by mouth daily as needed for allergies. 04/26/19   Sable Feil, PA-C  metoprolol tartrate (LOPRESSOR) 25 MG tablet TAKE 1 TABLET BY MOUTH TWICE A DAY Must keep upcoming appointment for further refills. 06/07/19   End, Harrell Gave, MD  ramipril (ALTACE) 10 MG capsule Take 1 capsule (10 mg total) by mouth 2 (two) times daily. 09/21/18   Towanda Malkin, MD  zolpidem (AMBIEN) 10 MG tablet Take 1 tablet (10 mg total) by mouth at bedtime as needed. for sleep 05/15/19 06/14/19  Sable Feil, PA-C    Review of Systems    He denies chest pain,  palpitations, pnd, orthopnea, n, v, syncope, weight gain, or early satiety.  He does report some dyspnea with extreme exertion and orthostatic hypotension if moving too quickly from seated to standing position without eating first.  He reports chronic/unchanged fatigue. He has b/l LEE that is noticed on exam but does not bother him.   All other systems reviewed and are otherwise negative except as noted above.  Physical Exam    VS:  BP 128/74 (BP Location: Left Arm, Patient Position: Sitting, Cuff Size: Normal)   Pulse 61   Ht 5\' 9"  (1.753 m)   Wt 166 lb 8 oz (75.5 kg)   SpO2 97%   BMI 24.59 kg/m  , BMI Body mass index is 24.59 kg/m. GEN: Well nourished, well developed, in no acute distress. HEENT: normal. Neck: Supple, no JVD, carotid bruits, or masses. Cardiac: RRR, no murmurs, rubs, or gallops. No clubbing, cyanosis. Bilateral 1+ lower extremity edema.  Radials/DP/PT 2+ and equal bilaterally.  Respiratory:  Respirations regular and unlabored, clear to auscultation bilaterally. GI: Soft, nontender, nondistended, BS + x 4. MS: no deformity or atrophy. Skin: warm and dry, no rash. Neuro:  Strength and sensation are intact. Psych: Normal affect.  Accessory  Clinical Findings    ECG personally reviewed by me today - SR, 61bpm, incomplete RBBB, poor R wave progression  - no acute changes.  VITALS Reviewed   Temp Readings from Last 3 Encounters:  05/15/19 98 F (36.7 C) (Oral)  03/23/19 98.7 F (37.1 C) (Oral)  11/17/18 (!) 97.3 F (36.3 C) (Oral)   BP Readings from Last 3 Encounters:  06/12/19 128/74  05/15/19 122/70  03/23/19 120/70   Pulse Readings from Last 3 Encounters:  06/12/19 61  05/15/19 64  03/23/19 60    Wt Readings from Last 3 Encounters:  06/12/19 166 lb 8 oz (75.5 kg)  05/15/19 166 lb 6.4 oz (75.5 kg)  03/23/19 160 lb (72.6 kg)     LABS  reviewed   CareEverwhere Labs present? Yes/No: No  Lab Results  Component Value Date   WBC 7.6 05/05/2019   HGB 14.7 05/05/2019   HCT 44.8 05/05/2019   MCV 87 05/05/2019   PLT 238 05/05/2019   Lab Results  Component Value Date   CREATININE 0.93 05/05/2019   BUN 13 05/05/2019   NA 139 05/05/2019   K 5.0 05/05/2019   CL 100 05/05/2019   CO2 26 02/05/2016   Lab Results  Component Value Date   ALT 20 05/05/2019   AST 20 05/05/2019   GGT 15 05/05/2019   ALKPHOS 75 05/05/2019   BILITOT 0.4 05/05/2019   Lab Results  Component Value Date   CHOL 111 05/05/2019   HDL 34 (L) 05/05/2019   LDLCALC 60 05/05/2019   TRIG 89 05/05/2019   CHOLHDL 3.3 05/05/2019    No results found for: HGBA1C Lab Results  Component Value Date   TSH 0.795 05/05/2019     STUDIES/PROCEDURES reviewed    CT 04/12/18 IMPRESSION: 1. Stable 8 mm pulmonary nodule in the left lower lobe. Given the patient's smoking history, I recommend 1 additional 12 month follow-up CT scan of the chest without contrast. 2. Emphysema in the upper lobes. ADDENDUM: Impression #3: 15 mm nodule in the inferior aspect of the left lobe of the thyroid gland near the inferior aspect of the isthmus. I recommend thyroid ultrasound for further characterization. This extends beneath the manubrium  06/11/18  Thryoid IMPRESSION: Left lower pole nodule 4 meets criteria for  fine needle aspiration biopsy and corresponds to the abnormality noted on the accompanying CT. Other nodules do not meet criteria for biopsy nor follow-up. The above is in keeping with the ACR TI-RADS recommendations Earlyne Iba Radiol 2017;14:587-595.  05/21/2017 AAA [place] Final Interpretation:  Abdominal Aorta: There is evidence of abnormal dilitation of the Mid  Abdominal aorta. The largest aortic measurement is 3.1 cm.  Recommendation for repeat US in 1 year   LHC  01/30/2016  Mid RCA lesion, 90 %stenosed.  A STENT PROMUS PREM MR 3.5X38 drug eluting stent was successfully placed, postdilated to 3.8 mm.  Post intervention, there is a 0% residual stenosis.  Mild, scattered plaque in the left sided vessels.  The left ventricular systolic function is normal. LV end diastolic pressure is mildly elevated, LVEDP 20 mm Hg.  The left ventricular ejection fraction is 55-65% by visual estimate. There is no aortic valve stenosis.  NM Stress 01/23/2016 Nuclear stress EF: 68%. The left ventricular ejection fraction is hyperdynamic (>65%). Upsloping ST segment depression ST segment depression of 2 mm was noted during stress in the II, III and aVF leads. This is an intermediate risk study. Defect 1: There is a medium defect of moderate severity present in the basal inferoseptal, mid inferoseptal, mid inferior and apical inferior location. Findings consistent with inferoseptal ischemia.  Assessment & Plan    Coronary artery disease --No symptoms to suggest worsening coronary insufficiency.  As previously noted, clopidogrel discontinued at his previous visit 2/2 bruising. He continues on ASA 81 mg daily without any signs or symptoms of bleeding.  Continue aggressive secondary prevention with statin and most recent LDL at goal. Continue to remain active and eat healthy.  HTN --BP relatively well controlled 128/72.  Currently on amlodipine 5 mg twice and metoprolol, which was decreased to metoprolol 25mg  BID at his last visit 2/2 bradycardia and fatigue.  He is not bothered by the LEE associated with amlodipine. He reports consistent blood pressure readings at home with that of today's reading at clinic.  Discussed goal BP <130/80. Low salt diet and continued home BP checks encouraged, especially given AAA as below.   AAA --Schedule AAA study. Previous study as above and 3.1cm. Continue strict BP and HR control. Avoid FQ and heavy lifting. HR and BP control emphasized.   HLD --LDLc on 05/05/19 was 60 and at goal <70. Continue to monitor cholesterol per PCP. Continue statin therapy with atorvastatin 20mg  daily.  Sinus bradycardia, resolved --Heart rate improved with cut of BB to metoprolol 25 mg twice daily.  TSH checked per PCP and unremarkable. Continue to remain active and lifestyle changes. Continue to consider further thyroid workup as below.  Solitary lung nodule --No sx. Schedule non contrast CTA. Order already placed earlier last year by Dr. 05/07/19 and needs scheduled.   Thyroid nodule --Discussed recommendation for bx. He still wishes to defer the needle aspiration at this time. Referral to endocrinology for further discussion deferred at this time. He will research the bx and look into the price associated with this procedure.    Medication changes: None Labs ordered: None Studies / Imaging ordered: AAA , CT without contrast Disposition: Follow-up 1 year    Okey Dupre, PA-C 06/12/2019, 10:01 AM

## 2019-06-09 ENCOUNTER — Other Ambulatory Visit: Payer: Self-pay | Admitting: Internal Medicine

## 2019-06-12 ENCOUNTER — Encounter: Payer: Self-pay | Admitting: Physician Assistant

## 2019-06-12 ENCOUNTER — Ambulatory Visit (INDEPENDENT_AMBULATORY_CARE_PROVIDER_SITE_OTHER): Payer: 59 | Admitting: Physician Assistant

## 2019-06-12 ENCOUNTER — Other Ambulatory Visit: Payer: Self-pay

## 2019-06-12 VITALS — BP 128/74 | HR 61 | Ht 69.0 in | Wt 166.5 lb

## 2019-06-12 DIAGNOSIS — R001 Bradycardia, unspecified: Secondary | ICD-10-CM

## 2019-06-12 DIAGNOSIS — I10 Essential (primary) hypertension: Secondary | ICD-10-CM

## 2019-06-12 DIAGNOSIS — E041 Nontoxic single thyroid nodule: Secondary | ICD-10-CM

## 2019-06-12 DIAGNOSIS — I2510 Atherosclerotic heart disease of native coronary artery without angina pectoris: Secondary | ICD-10-CM | POA: Diagnosis not present

## 2019-06-12 DIAGNOSIS — E785 Hyperlipidemia, unspecified: Secondary | ICD-10-CM

## 2019-06-12 DIAGNOSIS — I719 Aortic aneurysm of unspecified site, without rupture: Secondary | ICD-10-CM | POA: Diagnosis not present

## 2019-06-12 DIAGNOSIS — R911 Solitary pulmonary nodule: Secondary | ICD-10-CM

## 2019-06-12 NOTE — Patient Instructions (Signed)
Medication Instructions:  - Your physician recommends that you continue on your current medications as directed. Please refer to the Current Medication list given to you today.  *If you need a refill on your cardiac medications before your next appointment, please call your pharmacy*   Lab Work: - none ordered  If you have labs (blood work) drawn today and your tests are completely normal, you will receive your results only by: Marland Kitchen MyChart Message (if you have MyChart) OR . A paper copy in the mail If you have any lab test that is abnormal or we need to change your treatment, we will call you to review the results.   Testing/Procedures: - Your physician has requested that you have an abdominal aorta duplex. During this test, an ultrasound is used to evaluate the aorta. Allow 30 minutes for this exam. Do not eat after midnight the day before and avoid carbonated beverages  - Your physician has recommended that you have a non-contrasted Chest CT scan.  Please call the scheduling department at [phone] to schedule this directly.    Follow-Up: At [facility], you and your health needs are our priority.  As part of our continuing mission to provide you with exceptional heart care, we have created designated Provider Care Teams.  These Care Teams include your primary Cardiologist (physician) and Advanced Practice Providers (APPs -  Physician Assistants and Nurse Practitioners) who all work together to provide you with the care you need, when you need it.  We recommend signing up for the patient portal called "MyChart".  Sign up information is provided on this After Visit Summary.  MyChart is used to connect with patients for Virtual Visits (Telemedicine).  Patients are able to view lab/test results, encounter notes, upcoming appointments, etc.  Non-urgent messages can be sent to your provider as well.   To learn more about what you can do with MyChart, go to [email or web address].     Your next appointment:   1 year(s)  The format for your next appointment:   In Person  Provider:    You may see Yvonne Kendall, MD or one of the following Advanced Practice Providers on your designated Care Team:    Nicolasa Ducking, NP  Eula Listen, PA-C  Marisue Ivan, PA-C    Other Instructions N/a

## 2019-06-29 ENCOUNTER — Other Ambulatory Visit: Payer: Self-pay

## 2019-06-29 DIAGNOSIS — F411 Generalized anxiety disorder: Secondary | ICD-10-CM

## 2019-06-29 MED ORDER — CLONAZEPAM 1 MG PO TABS: 1.0000 mg | ORAL_TABLET | Freq: Two times a day (BID) | ORAL | 1 refills | Status: DC | PRN

## 2019-07-10 ENCOUNTER — Other Ambulatory Visit: Payer: Self-pay | Admitting: Internal Medicine

## 2019-07-13 ENCOUNTER — Other Ambulatory Visit: Payer: Self-pay

## 2019-07-13 DIAGNOSIS — G4700 Insomnia, unspecified: Secondary | ICD-10-CM

## 2019-07-13 MED ORDER — ZOLPIDEM TARTRATE 10 MG PO TABS: 10.0000 mg | ORAL_TABLET | Freq: Every evening | ORAL | 0 refills | Status: DC | PRN

## 2019-07-26 ENCOUNTER — Telehealth: Payer: Self-pay | Admitting: General Practice

## 2019-07-26 DIAGNOSIS — F411 Generalized anxiety disorder: Secondary | ICD-10-CM

## 2019-07-26 NOTE — Telephone Encounter (Signed)
Needs a refill on KLONOPIN.

## 2019-07-26 NOTE — Telephone Encounter (Signed)
Paul Koch refilled Clonazepam on 06/29/19 with 1 refill.  Needs to contact pharmacy for the refill.  Needs to schedule appt for 08/2019 per Albina Billet, NP-C for 3 month face to face.  AMD

## 2019-08-23 ENCOUNTER — Ambulatory Visit: Payer: Managed Care, Other (non HMO)

## 2019-08-24 ENCOUNTER — Other Ambulatory Visit: Payer: Self-pay

## 2019-08-24 ENCOUNTER — Ambulatory Visit: Payer: Self-pay | Admitting: Emergency Medicine

## 2019-08-24 ENCOUNTER — Encounter: Payer: Self-pay | Admitting: Emergency Medicine

## 2019-08-24 VITALS — BP 137/84 | HR 56 | Temp 96.9°F | Resp 16 | Ht 69.5 in | Wt 164.0 lb

## 2019-08-24 DIAGNOSIS — J302 Other seasonal allergic rhinitis: Secondary | ICD-10-CM

## 2019-08-24 DIAGNOSIS — F411 Generalized anxiety disorder: Secondary | ICD-10-CM

## 2019-08-24 DIAGNOSIS — Z760 Encounter for issue of repeat prescription: Secondary | ICD-10-CM

## 2019-08-24 DIAGNOSIS — G4700 Insomnia, unspecified: Secondary | ICD-10-CM

## 2019-08-24 MED ORDER — ASPIRIN EC 81 MG PO TBEC: 81.0000 mg | DELAYED_RELEASE_TABLET | Freq: Every day | ORAL | 6 refills | Status: DC

## 2019-08-24 MED ORDER — LORATADINE 10 MG PO TABS: 10.0000 mg | ORAL_TABLET | Freq: Every day | ORAL | 6 refills | Status: DC | PRN

## 2019-08-24 MED ORDER — SILDENAFIL CITRATE 25 MG PO TABS
25.0000 mg | ORAL_TABLET | Freq: Every day | ORAL | 1 refills | Status: DC | PRN
Start: 2019-08-24 — End: 2021-11-19

## 2019-08-24 MED ORDER — RAMIPRIL 10 MG PO CAPS: 10.0000 mg | ORAL_CAPSULE | Freq: Two times a day (BID) | ORAL | 6 refills | Status: DC

## 2019-08-24 MED ORDER — AMLODIPINE BESYLATE 5 MG PO TABS: 5.0000 mg | ORAL_TABLET | Freq: Two times a day (BID) | ORAL | 6 refills | Status: DC

## 2019-08-24 MED ORDER — METOPROLOL TARTRATE 25 MG PO TABS: ORAL_TABLET | ORAL | 6 refills | Status: DC

## 2019-08-24 MED ORDER — CLONAZEPAM 1 MG PO TABS: 1.0000 mg | ORAL_TABLET | Freq: Two times a day (BID) | ORAL | 5 refills | Status: DC | PRN

## 2019-08-24 MED ORDER — ZOLPIDEM TARTRATE 10 MG PO TABS: 10.0000 mg | ORAL_TABLET | Freq: Every evening | ORAL | 5 refills | Status: DC | PRN

## 2019-08-24 MED ORDER — ATORVASTATIN CALCIUM 20 MG PO TABS: ORAL_TABLET | ORAL | 6 refills | Status: DC

## 2019-08-24 NOTE — Progress Notes (Signed)
Needs Rx refill for Ambien.

## 2019-08-24 NOTE — Progress Notes (Signed)
  [facility] Provider Note       Time seen: 11:39 AM  -----------------------------------------   I have reviewed the vital signs and the nursing notes.  HISTORY   Chief Complaint Medication Refill    HPI Paul Koch is a 59 y.o. male with a history of AAA, anxiety, coronary disease, COPD, erectile dysfunction, hypertension who presents today for prescription refill.  Patient is requesting refills of Klonopin as well as Ambien.  Would like to retry Viagra if possible.  Past Medical History:  Diagnosis Date  . AAA (abdominal aortic aneurysm) (HCC)   . Anxiety disorder   . CAD (coronary artery disease)   . COPD (chronic obstructive pulmonary disease) (HCC)   . Eczema   . ED (erectile dysfunction)   . Elevated lipids   . Hyperplastic colon polyp 2014   due 2024  . Hypertension   . Lung nodule     Past Surgical History:  Procedure Laterality Date  . CARDIAC CATHETERIZATION N/A 01/30/2016   Procedure: Left Heart Cath and Coronary Angiography;  Surgeon: Corky Crafts, MD;  Location: [facility] INVASIVE CV LAB;  Service: Cardiovascular;  Laterality: N/A;  . CARDIAC CATHETERIZATION N/A 01/30/2016   Procedure: Coronary Stent Intervention;  Surgeon: Corky Crafts, MD;  Location: [facility] INVASIVE CV LAB;  Service: Cardiovascular;  Laterality: N/A;  . TONSILLECTOMY AND ADENOIDECTOMY    . VASECTOMY      Allergies Clindamycin hcl  Review of Systems Constitutional: Negative for fever. Cardiovascular: Negative for chest pain. Respiratory: Negative for shortness of breath. Gastrointestinal: Negative for abdominal pain, vomiting and diarrhea. Genitourinary: Positive for erectile dysfunction Musculoskeletal: Positive for intermittent left shoulder pain Skin: Negative for rash. Neurological: Negative for headaches, focal weakness or numbness.  All systems negative/normal/unremarkable except as stated in the  HPI  ____________________________________________   PHYSICAL EXAM:  VITAL SIGNS: Vitals:   08/24/19 1129  BP: 137/84  Pulse: (!) 56  Resp: 16  Temp: (!) 96.9 F (36.1 C)  SpO2: 96%    Constitutional: Alert and oriented. Well appearing and in no distress. Eyes: Conjunctivae are normal. Normal extraocular movements. ENT      Head: Normocephalic and atraumatic.      Nose: No congestion/rhinnorhea.      Mouth/Throat: Mucous membranes are moist.      Neck: No stridor. Cardiovascular: Normal rate, regular rhythm. No murmurs, rubs, or gallops. Respiratory: Normal respiratory effort without tachypnea nor retractions. Breath sounds are clear and equal bilaterally. No wheezes/rales/rhonchi. Gastrointestinal: Soft and nontender. Normal bowel sounds Musculoskeletal: Nontender with normal range of motion in extremities. No lower extremity tenderness nor edema. Neurologic:  Normal speech and language. No gross focal neurologic deficits are appreciated.  Skin:  Skin is warm, dry and intact. No rash noted. Psychiatric: Speech and behavior are normal.   DIFFERENTIAL DIAGNOSIS  Medication refill, erectile dysfunction, anxiety  ASSESSMENT AND PLAN  Medication refill, erectile dysfunction, anxiety   Plan: The patient had presented for medication refill.  Overall he appears to be doing well, no acute distress.  We will refill his medications and follow-up in 6 months for reevaluation. Daryel November MD    Note: This note was generated in part or whole with voice recognition software. Voice recognition is usually quite accurate but there are transcription errors that can and very often do occur. I apologize for any typographical errors that were not detected and corrected.

## 2019-09-18 ENCOUNTER — Other Ambulatory Visit: Payer: 59

## 2019-10-10 ENCOUNTER — Other Ambulatory Visit: Payer: Self-pay

## 2019-10-10 DIAGNOSIS — I10 Essential (primary) hypertension: Secondary | ICD-10-CM

## 2019-10-10 MED ORDER — RAMIPRIL 10 MG PO CAPS: 10.0000 mg | ORAL_CAPSULE | Freq: Two times a day (BID) | ORAL | 6 refills | Status: DC

## 2019-10-10 MED ORDER — METOPROLOL TARTRATE 25 MG PO TABS: ORAL_TABLET | ORAL | 6 refills | Status: DC

## 2019-10-25 ENCOUNTER — Other Ambulatory Visit: Payer: Self-pay

## 2019-10-25 ENCOUNTER — Ambulatory Visit (INDEPENDENT_AMBULATORY_CARE_PROVIDER_SITE_OTHER): Payer: 59

## 2019-10-25 DIAGNOSIS — I719 Aortic aneurysm of unspecified site, without rupture: Secondary | ICD-10-CM

## 2019-10-25 DIAGNOSIS — I714 Abdominal aortic aneurysm, without rupture: Secondary | ICD-10-CM | POA: Diagnosis not present

## 2019-11-02 ENCOUNTER — Telehealth: Payer: Self-pay | Admitting: *Deleted

## 2019-11-02 DIAGNOSIS — I719 Aortic aneurysm of unspecified site, without rupture: Secondary | ICD-10-CM

## 2019-11-02 NOTE — Telephone Encounter (Signed)
No answer. No voicemail. 

## 2019-11-02 NOTE — Telephone Encounter (Signed)
-----   Message from Lennon Alstrom, PA-C sent at 11/02/2019  2:11 PM EDT ----- Please let the patient know that his study showed an abnormal enlargement of his abdominal aorta that is increased in size (3.1cm to 3.9 cm), as well as is noted in additional areas of his aorta. Recommendation per his report was for both a vascular consult and follow-up study in 12 months (mentioned in case he is looking at it [place]).  However, findings as above were discussed with his primary cardiologist with recommendation that (before referral to vascular) repeat imaging be performed in 3-6 months, so that we can monitor the progression of his enlargement (before referring to vascular surgery). Please also order him a transthoracic echo, given his new areas of dilation, so that we can rule out a bicuspid aortic valve.  I will send him additional information and guidelines via mychart for BP and medications to avoid.

## 2019-11-06 NOTE — Telephone Encounter (Signed)
Patient wife says patient is sleeping and to call Wednesday 9-12

## 2019-11-06 NOTE — Telephone Encounter (Signed)
Patient not home. Spoke with patient's wife, ok per DPR.  She verbalized understanding of the results and plan of care. She will relay the information to the patient and have him call back to schedule the echocardiogram and the repeat CT, as she does not know his schedule.  Order for Echo (needs at earliest convenience) and AAA ultrasound (in 3-6 months) entered.  Routing to scheduling to arrange with patient.

## 2019-12-05 ENCOUNTER — Other Ambulatory Visit: Payer: Self-pay

## 2019-12-05 MED ORDER — FLUOXETINE HCL 20 MG PO TABS: 60.0000 mg | ORAL_TABLET | Freq: Every day | ORAL | 5 refills | Status: DC

## 2019-12-19 ENCOUNTER — Other Ambulatory Visit: Payer: Self-pay | Admitting: Physician Assistant

## 2019-12-19 DIAGNOSIS — I719 Aortic aneurysm of unspecified site, without rupture: Secondary | ICD-10-CM

## 2019-12-19 DIAGNOSIS — R001 Bradycardia, unspecified: Secondary | ICD-10-CM

## 2019-12-20 ENCOUNTER — Other Ambulatory Visit: Payer: Self-pay

## 2019-12-20 ENCOUNTER — Ambulatory Visit (INDEPENDENT_AMBULATORY_CARE_PROVIDER_SITE_OTHER): Payer: 59

## 2019-12-20 DIAGNOSIS — I719 Aortic aneurysm of unspecified site, without rupture: Secondary | ICD-10-CM | POA: Diagnosis not present

## 2019-12-20 DIAGNOSIS — R001 Bradycardia, unspecified: Secondary | ICD-10-CM

## 2019-12-20 LAB — ECHOCARDIOGRAM COMPLETE
AR max vel: 2.07 cm2
AV Area VTI: 2.13 cm2
AV Area mean vel: 2.1 cm2
AV Mean grad: 3 mmHg
AV Peak grad: 5.6 mmHg
Ao pk vel: 1.18 m/s
Area-P 1/2: 3.42 cm2
Calc EF: 48.5 %
S' Lateral: 3 cm
Single Plane A2C EF: 49.9 %
Single Plane A4C EF: 47.8 %

## 2019-12-28 ENCOUNTER — Other Ambulatory Visit: Payer: Self-pay | Admitting: Emergency Medicine

## 2019-12-28 DIAGNOSIS — F411 Generalized anxiety disorder: Secondary | ICD-10-CM

## 2020-01-01 ENCOUNTER — Telehealth: Payer: Self-pay | Admitting: Physician Assistant

## 2020-01-01 NOTE — Telephone Encounter (Signed)
Attempted to call the patient at his home #- no answer/ no voice mail after multiple rings. Attempted to call his cell #- no answer/ no voice mail set up at this time.

## 2020-01-01 NOTE — Telephone Encounter (Signed)
Lennon Alstrom, PA-C  12/22/2019 3:56 PM EDT     Routing echo results to reading MD, Dr. Azucena Cecil. Echo was performed to ensure that pt did not have a bicuspid valve. Can you verify this is the case before I result this note for the patient. Thank you!    Sherlynn Stalls Rimmer, RN  12/29/2019 10:32 AM EDT     Message sent to Leafy [employer] to let us know if its been verified and if we need to call any result.    Lennon Alstrom, PA-C  12/31/2019 11:08 PM EDT     Dr. Azucena Cecil sent a private message to me and confirmed that the aortic valve is not a bicuspid valve, which is good news. He stated that the valve looks normal.   Please let the patient know that his echo shows normal pump function and that the walls of the heart are moving normally. His valves appear normal. This is a reassuring study.

## 2020-01-02 NOTE — Telephone Encounter (Signed)
Attempted to contact the patient. Home phone rings out na/nv. Cell no answer,does not have a voicemail set up.

## 2020-01-05 NOTE — Telephone Encounter (Addendum)
Home number - no answer or voicemail after several rings. Mobile number- no answer and no voice mail set up.  Pt has reviewed results in My Chart.

## 2020-02-22 ENCOUNTER — Other Ambulatory Visit: Payer: Self-pay

## 2020-02-22 ENCOUNTER — Encounter: Payer: Self-pay | Admitting: Nurse Practitioner

## 2020-02-22 ENCOUNTER — Ambulatory Visit: Payer: Self-pay | Admitting: Nurse Practitioner

## 2020-02-22 ENCOUNTER — Ambulatory Visit: Payer: 59

## 2020-02-22 VITALS — BP 141/93 | HR 61 | Temp 98.0°F | Resp 12 | Wt 154.0 lb

## 2020-02-22 DIAGNOSIS — Z760 Encounter for issue of repeat prescription: Secondary | ICD-10-CM

## 2020-02-22 DIAGNOSIS — F411 Generalized anxiety disorder: Secondary | ICD-10-CM

## 2020-02-22 MED ORDER — CLONAZEPAM 1 MG PO TABS: 1.0000 mg | ORAL_TABLET | Freq: Two times a day (BID) | ORAL | 5 refills | Status: DC | PRN

## 2020-02-22 NOTE — Patient Instructions (Signed)

## 2020-02-22 NOTE — Progress Notes (Signed)
Subjective:    Patient ID: Paul Koch, male    DOB: November 08, 1960, 59 y.o.   MRN: [id number]  HPI  Paul Koch is a 59 y.o. male who presents to the [facility] for medication refill. He reports he is doing fine and no problems or complaints at this time.     Past Medical History:  Diagnosis Date   AAA (abdominal aortic aneurysm) (HCC)    Anxiety disorder    CAD (coronary artery disease)    COPD (chronic obstructive pulmonary disease) (HCC)    Eczema    ED (erectile dysfunction)    Elevated lipids    Hyperplastic colon polyp 2014   due 2024   Hypertension    Lung nodule    Past Surgical History:  Procedure Laterality Date   CARDIAC CATHETERIZATION N/A 01/30/2016   Procedure: Left Heart Cath and Coronary Angiography;  Surgeon: Corky Crafts, MD;  Location: [facility] INVASIVE CV LAB;  Service: Cardiovascular;  Laterality: N/A;   CARDIAC CATHETERIZATION N/A 01/30/2016   Procedure: Coronary Stent Intervention;  Surgeon: Corky Crafts, MD;  Location: [facility] INVASIVE CV LAB;  Service: Cardiovascular;  Laterality: N/A;   TONSILLECTOMY AND ADENOIDECTOMY     VASECTOMY     Current Outpatient Medications on File Prior to Visit  Medication Sig Dispense Refill   amLODipine (NORVASC) 5 MG tablet Take 1 tablet (5 mg total) by mouth 2 (two) times daily. 180 tablet 6   aspirin EC 81 MG tablet Take 1 tablet (81 mg total) by mouth daily. 30 tablet 6   atorvastatin (LIPITOR) 20 MG tablet Take 1 tablet by mouth daily 90 tablet 6   clonazePAM (KLONOPIN) 1 MG tablet Take 1 tablet (1 mg total) by mouth 2 (two) times daily as needed for anxiety. 60 tablet 5   FLUoxetine (PROZAC) 20 MG tablet TAKE 3 TABLETS BY MOUTH EVERY DAY 270 tablet 2   loratadine (CLARITIN) 10 MG tablet Take 1 tablet (10 mg total) by mouth daily as needed for allergies. 90 tablet 6   metoprolol tartrate (LOPRESSOR) 25 MG tablet TAKE 1 TABLET BY MOUTH TWICE A DAY 180 tablet 6   ramipril (ALTACE) 10 MG capsule  Take 1 capsule (10 mg total) by mouth 2 (two) times daily. 180 capsule 6   sildenafil (VIAGRA) 25 MG tablet Take 1 tablet (25 mg total) by mouth daily as needed for erectile dysfunction. 10 tablet 1   zolpidem (AMBIEN) 10 MG tablet Take 1 tablet (10 mg total) by mouth at bedtime as needed. for sleep 30 tablet 5   No current facility-administered medications on file prior to visit.    Allergies  Allergen Reactions   Clindamycin Hcl     REACTION: stomach upset    Objective: BP (!) 141/93 (BP Location: Left Arm, Patient Position: Sitting, Cuff Size: Normal)    Pulse 61    Temp 98 F (36.7 C) (Oral)    Resp 12    Wt 154 lb (69.9 kg)    SpO2 96%    BMI 22.42 kg/m     Physical Exam Vitals and nursing note reviewed.  Constitutional:      Appearance: Normal appearance.  HENT:     Head: Normocephalic.  Eyes:     Conjunctiva/sclera: Conjunctivae normal.  Cardiovascular:     Rate and Rhythm: Normal rate and regular rhythm.  Pulmonary:     Effort: Pulmonary effort is normal.     Breath sounds: Normal breath sounds.  Musculoskeletal:  General: Normal range of motion.     Cervical back: Normal range of motion.  Skin:    General: Skin is warm and dry.  Neurological:     General: No focal deficit present.     Mental Status: He is alert.  Psychiatric:        Mood and Affect: Mood normal.       Assessment & Plan:  59 y.o. male here for medication refill for anxiety disorder. Will refill Klonopin. He will return in 3 months or sooner for problems.

## 2020-03-13 ENCOUNTER — Ambulatory Visit (INDEPENDENT_AMBULATORY_CARE_PROVIDER_SITE_OTHER): Payer: 59

## 2020-03-13 ENCOUNTER — Other Ambulatory Visit: Payer: Self-pay

## 2020-03-13 DIAGNOSIS — I714 Abdominal aortic aneurysm, without rupture: Secondary | ICD-10-CM

## 2020-03-13 DIAGNOSIS — I719 Aortic aneurysm of unspecified site, without rupture: Secondary | ICD-10-CM

## 2020-03-14 ENCOUNTER — Telehealth: Payer: Self-pay

## 2020-03-14 ENCOUNTER — Other Ambulatory Visit: Payer: Self-pay

## 2020-03-14 DIAGNOSIS — I700 Atherosclerosis of aorta: Secondary | ICD-10-CM

## 2020-03-14 NOTE — Telephone Encounter (Signed)
Able to reach Paul Koch regarding his recent procedure of his abdominal aorta, Marisue Ivan, PA-C had a chance to review his results and advised   "Abdominal aorta is enlarged throughout with largest measurement 3.7 cm, which is decreased from recent 10/2019 study of 3.9 cm. For reference, 05/2017 study was at 3.1 cm.     Recommendation was for repeat abdominal aorta study in 1 year to monitor.   Per primary cardiologist, we will defer vascular surgery referral and reasonable to continue to monitor with follow-up imaging / ultrasound in 1 year"  Paul Koch is pleaseable with results, will wait to hear from vascular surgery referral and go from there. Otherwise all questions or concerns were address and no additional concerns at this time. Agreeable to plan, will call back for anything further.    Paul Koch request that we mail his results and call his occupational health team [facility]) to have them aware of his results to see if that needs to be uploaded into his file.

## 2020-03-14 NOTE — Telephone Encounter (Signed)
-----   Message from Lennon Alstrom, PA-C sent at 03/14/2020  1:40 PM EST ----- Abdominal aorta is enlarged throughout with largest measurement 3.7 cm, which is decreased from recent 10/2019 study of 3.9 cm. For reference, 05/2017 study was at 3.1 cm.     Recommendation was for repeat abdominal aorta study in 1 year to monitor.  Will Cc primary cardiologist to keep in the loop, given Dr. Serita Kyle plan was to re-image his aorta to re-assess if should proceed with vascular surgery referral. Further recommendations if indicated per MD.

## 2020-03-14 NOTE — Progress Notes (Signed)
Order for referral placed to vascular surgery as requested by Marisue Ivan, PA-C

## 2020-03-20 ENCOUNTER — Other Ambulatory Visit: Payer: Self-pay | Admitting: Emergency Medicine

## 2020-03-20 DIAGNOSIS — I2510 Atherosclerotic heart disease of native coronary artery without angina pectoris: Secondary | ICD-10-CM

## 2020-03-25 ENCOUNTER — Other Ambulatory Visit: Payer: Self-pay

## 2020-03-25 DIAGNOSIS — G4700 Insomnia, unspecified: Secondary | ICD-10-CM

## 2020-03-25 MED ORDER — ZOLPIDEM TARTRATE 10 MG PO TABS: 10.0000 mg | ORAL_TABLET | Freq: Every evening | ORAL | 2 refills | Status: DC | PRN

## 2020-04-04 ENCOUNTER — Other Ambulatory Visit: Payer: Self-pay

## 2020-04-04 DIAGNOSIS — G4700 Insomnia, unspecified: Secondary | ICD-10-CM

## 2020-04-04 MED ORDER — ZOLPIDEM TARTRATE 10 MG PO TABS: 10.0000 mg | ORAL_TABLET | Freq: Every evening | ORAL | 2 refills | Status: DC | PRN

## 2020-04-09 ENCOUNTER — Other Ambulatory Visit: Payer: Self-pay

## 2020-04-09 ENCOUNTER — Other Ambulatory Visit: Payer: Self-pay | Admitting: Physician Assistant

## 2020-04-09 DIAGNOSIS — G4700 Insomnia, unspecified: Secondary | ICD-10-CM

## 2020-04-09 MED ORDER — ZOLPIDEM TARTRATE 10 MG PO TABS: 10.0000 mg | ORAL_TABLET | Freq: Every evening | ORAL | 1 refills | Status: DC | PRN

## 2020-04-09 MED ORDER — ZOLPIDEM TARTRATE 10 MG PO TABS: 10.0000 mg | ORAL_TABLET | Freq: Every evening | ORAL | 2 refills | Status: DC | PRN

## 2020-04-25 ENCOUNTER — Other Ambulatory Visit: Payer: Self-pay

## 2020-04-25 DIAGNOSIS — G4700 Insomnia, unspecified: Secondary | ICD-10-CM

## 2020-04-30 ENCOUNTER — Other Ambulatory Visit: Payer: Self-pay

## 2020-04-30 ENCOUNTER — Encounter: Payer: Self-pay | Admitting: Adult Medicine

## 2020-04-30 ENCOUNTER — Ambulatory Visit: Payer: Self-pay | Admitting: Adult Medicine

## 2020-04-30 VITALS — BP 151/86 | HR 71 | Temp 96.6°F | Wt 161.0 lb

## 2020-04-30 DIAGNOSIS — Z760 Encounter for issue of repeat prescription: Secondary | ICD-10-CM

## 2020-04-30 MED ORDER — ZOLPIDEM TARTRATE 10 MG PO TABS: 10.0000 mg | ORAL_TABLET | Freq: Every evening | ORAL | 0 refills | Status: DC | PRN

## 2020-04-30 NOTE — Progress Notes (Signed)
Routine I have reviewed the triage vital signs and the nursing notes.   HISTORY  Chief Complaint    No complaint today. Wanted to make appt for review of Ambien per request of prior provider HPI Paul Koch is a 60 y.o. 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 Orginally gn qd    Past Medical History:  Diagnosis Date  . AAA (abdominal aortic aneurysm) (HCC)   . Anxiety disorder   . CAD (coronary artery disease)   . COPD (chronic obstructive pulmonary disease) (HCC)   . Eczema   . ED (erectile dysfunction)   . Elevated lipids   . Hyperplastic colon polyp 2014   due 2024  . Hypertension   . Lung nodule     Patient Active Problem List   Diagnosis Date Noted  . Non-functional thyroid nodule 11/17/2018  . Sinus bradycardia 02/09/2018  . Pain in both lower extremities 02/10/2017  . Coronary artery disease 05/14/2016  . Hyperlipidemia LDL goal <70 05/14/2016  . Arteriosclerosis of abdominal aorta (HCC) 05/14/2016  . Solitary lung nodule 05/14/2016  . Abnormal stress test   . 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 ECZEMA 08/06/2008  . DERMATOPHYTOSIS OF NAIL 07/14/2007  . INGROWING NAIL 07/14/2007  . ANXIETY DISORDER 06/02/2007  . Essential hypertension 06/02/2007    Past Surgical History:  Procedure Laterality Date  . CARDIAC CATHETERIZATION N/A 01/30/2016   Procedure: Left Heart Cath and Coronary Angiography;  Surgeon: Corky Crafts, MD;  Location: [facility] INVASIVE CV LAB;  Service: Cardiovascular;  Laterality: N/A;  . CARDIAC CATHETERIZATION N/A 01/30/2016   Procedure: Coronary Stent Intervention;  Surgeon: Corky Crafts, MD;  Location: [facility] INVASIVE CV LAB;  Service: Cardiovascular;  Laterality: N/A;  . TONSILLECTOMY AND ADENOIDECTOMY    . VASECTOMY      Prior to Admission medications   Medication Sig Start Date End Date Taking? Authorizing Provider  amLODipine (NORVASC) 5 MG tablet Take 1  tablet (5 mg total) by mouth 2 (two) times daily. 08/24/19   Emily Filbert, MD  ASPIRIN LOW DOSE 81 MG EC tablet TAKE 1 TABLET BY MOUTH EVERY DAY 03/20/20   Joni Reining, PA-C  atorvastatin (LIPITOR) 20 MG tablet Take 1 tablet by mouth daily 08/24/19   Emily Filbert, MD  clonazePAM (KLONOPIN) 1 MG tablet Take 1 tablet (1 mg total) by mouth 2 (two) times daily as needed for anxiety. 02/22/20   Janne Napoleon, NP  FLUoxetine (PROZAC) 20 MG tablet TAKE 3 TABLETS BY MOUTH EVERY DAY 12/28/19   Joni Reining, PA-C  loratadine (CLARITIN) 10 MG tablet Take 1 tablet (10 mg total) by mouth daily as needed for allergies. 08/24/19   Emily Filbert, MD  metoprolol tartrate (LOPRESSOR) 25 MG tablet TAKE 1 TABLET BY MOUTH TWICE A DAY 10/10/19   Emily Filbert, MD  ramipril (ALTACE) 10 MG capsule Take 1 capsule (10 mg total) by mouth 2 (two) times daily. 10/10/19   Emily Filbert, MD  sildenafil (VIAGRA) 25 MG tablet Take 1 tablet (25 mg total) by mouth daily as needed for erectile dysfunction. 08/24/19   Emily Filbert, MD  zolpidem (AMBIEN) 10 MG tablet Take 1 tablet (10 mg total) by mouth at bedtime as needed for sleep. 04/09/20 05/09/20  Joni Reining, PA-C    Allergies Clindamycin hcl  Family History  Problem Relation Age of Onset  . Brain  cancer Mother   . Lung cancer Mother   . Arrhythmia Mother   . Hypertension Mother   . Hyperlipidemia Mother   . Bladder Cancer Mother   . Stroke Mother   . Emphysema Father     Social History Social History   Tobacco Use  . Smoking status: Former Smoker    Packs/day: 1.00    Years: 20.00    Pack years: 20.00    Types: Cigarettes    Quit date: 2004    Years since quitting: 18.0  . Smokeless tobacco: Never Used  Substance Use Topics  . Alcohol use: No    Comment: social (5 drinks/year)  . Drug use: No    Review of Systems  Constitutional: No fever/chills Eyes: No visual changes. ENT: No sore  throat. Cardiovascular: Denies chest pain. Respiratory: Denies shortness of breath. Gastrointestinal: No abdominal pain.  No nausea, no vomiting.  No diarrhea.  No constipation. Genitourinary: Negative for dysuria. Musculoskeletal: Negative for back pain. Skin: Negative for rash. Neurological: Negative for headaches, focal weakness or numbness. Psychiatric: denies mood or cognition changes,   ____________________________________________   PHYSICAL EXAM:  VITAL SIGNS: Repeat bp 132/80 Slender with good appetite WM in NAD  Constitutional: Alert and oriented. Well appearing and in no acute distress. Eyes: Conjunctivae are normal. PERRL. EOMI. Head: Atraumatic. Nose: No congestion/rhinnorhea. Mouth/Throat: Mucous membranes are moist.  Oropharynx non-erythematous. Neck: No stridor.   Cardiovascular: Normal rate, regular rhythm. Grossly normal heart sounds.   Respiratory. Lungs CTAB. Gastrointestinal: Soft and nontender. No distention. No abdominal bruits. No CVA tenderness. Genitourinary: deferred Musculoskeletal: No lower extremity tenderness   Neurologic:  Normal speech and language. No gross focal neurologic deficits are appreciated. No gait instability. Skin:  Skin is warm, dry and intact. No rash noted. Psychiatric: Mood and affect are normal. Speech and behavior are normal. 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

## 2020-04-30 NOTE — Progress Notes (Signed)
BP Recheck Manual (left) = 134/82

## 2020-05-02 ENCOUNTER — Other Ambulatory Visit: Payer: Self-pay

## 2020-05-02 DIAGNOSIS — F411 Generalized anxiety disorder: Secondary | ICD-10-CM

## 2020-05-07 ENCOUNTER — Encounter (INDEPENDENT_AMBULATORY_CARE_PROVIDER_SITE_OTHER): Payer: Self-pay | Admitting: Vascular Surgery

## 2020-05-07 MED ORDER — CLONAZEPAM 1 MG PO TABS: 1.0000 mg | ORAL_TABLET | Freq: Two times a day (BID) | ORAL | 5 refills | Status: DC | PRN

## 2020-05-08 ENCOUNTER — Other Ambulatory Visit: Payer: Self-pay

## 2020-05-08 DIAGNOSIS — G4700 Insomnia, unspecified: Secondary | ICD-10-CM

## 2020-05-08 DIAGNOSIS — F411 Generalized anxiety disorder: Secondary | ICD-10-CM

## 2020-05-08 MED ORDER — ZOLPIDEM TARTRATE 10 MG PO TABS: 10.0000 mg | ORAL_TABLET | Freq: Every evening | ORAL | 2 refills | Status: DC | PRN

## 2020-05-08 MED ORDER — CLONAZEPAM 1 MG PO TABS: 1.0000 mg | ORAL_TABLET | Freq: Two times a day (BID) | ORAL | 5 refills | Status: DC | PRN

## 2020-05-09 ENCOUNTER — Other Ambulatory Visit: Payer: Self-pay

## 2020-05-09 ENCOUNTER — Other Ambulatory Visit: Payer: Self-pay | Admitting: Physician Assistant

## 2020-05-09 DIAGNOSIS — F411 Generalized anxiety disorder: Secondary | ICD-10-CM

## 2020-05-09 DIAGNOSIS — G4700 Insomnia, unspecified: Secondary | ICD-10-CM

## 2020-05-09 MED ORDER — ZOLPIDEM TARTRATE 10 MG PO TABS: 10.0000 mg | ORAL_TABLET | Freq: Every evening | ORAL | 2 refills | Status: DC | PRN

## 2020-05-09 MED ORDER — CLONAZEPAM 1 MG PO TABS: 1.0000 mg | ORAL_TABLET | Freq: Two times a day (BID) | ORAL | 5 refills | Status: DC

## 2020-05-24 NOTE — Progress Notes (Deleted)
Pt scheduled to complete physical 06/03/20. CL,RMA 

## 2020-05-27 DIAGNOSIS — Z0000 Encounter for general adult medical examination without abnormal findings: Secondary | ICD-10-CM

## 2020-05-27 NOTE — Progress Notes (Signed)
Scheduled to complete physical 06/03/2020 with Bridget Hartshorn, PA-C [facility] Interim Provider).  AMD

## 2020-05-28 ENCOUNTER — Other Ambulatory Visit: Payer: Self-pay

## 2020-05-28 ENCOUNTER — Ambulatory Visit: Payer: Self-pay

## 2020-05-28 DIAGNOSIS — Z0000 Encounter for general adult medical examination without abnormal findings: Secondary | ICD-10-CM

## 2020-05-29 ENCOUNTER — Other Ambulatory Visit (INDEPENDENT_AMBULATORY_CARE_PROVIDER_SITE_OTHER): Payer: Self-pay | Admitting: Vascular Surgery

## 2020-05-29 DIAGNOSIS — I700 Atherosclerosis of aorta: Secondary | ICD-10-CM

## 2020-05-29 LAB — CMP12+LP+TP+TSH+6AC+PSA+CBC…
ALT: 16 IU/L (ref 0–44)
AST: 16 IU/L (ref 0–40)
Albumin/Globulin Ratio: 1.6 (ref 1.2–2.2)
Albumin: 4.2 g/dL (ref 3.8–4.8)
Alkaline Phosphatase: 76 IU/L (ref 44–121)
BUN/Creatinine Ratio: 12 (ref 10–24)
BUN: 11 mg/dL (ref 8–27)
Basophils Absolute: 0 10*3/uL (ref 0.0–0.2)
Basos: 0 %
Bilirubin Total: 0.4 mg/dL (ref 0.0–1.2)
Calcium: 8.6 mg/dL (ref 8.6–10.2)
Chloride: 100 mmol/L (ref 96–106)
Chol/HDL Ratio: 3.5 ratio (ref 0.0–5.0)
Cholesterol, Total: 115 mg/dL (ref 100–199)
Creatinine, Ser: 0.95 mg/dL (ref 0.76–1.27)
EOS (ABSOLUTE): 0.1 10*3/uL (ref 0.0–0.4)
Eos: 2 %
Estimated CHD Risk: 0.6 times avg. (ref 0.0–1.0)
Free Thyroxine Index: 2.6 (ref 1.2–4.9)
GFR calc Af Amer: 99 mL/min/{1.73_m2} (ref >59)
GFR calc non Af Amer: 86 mL/min/{1.73_m2} (ref >59)
GGT: 20 IU/L (ref 0–65)
Globulin, Total: 2.6 g/dL (ref 1.5–4.5)
Glucose: 96 mg/dL (ref 65–99)
HDL: 33 mg/dL — ABNORMAL LOW (ref >39)
Hematocrit: 43.4 % (ref 37.5–51.0)
Hemoglobin: 14.8 g/dL (ref 13.0–17.7)
Immature Grans (Abs): 0 10*3/uL (ref 0.0–0.1)
Immature Granulocytes: 0 %
Iron: 74 ug/dL (ref 38–169)
LDH: 156 IU/L (ref 121–224)
LDL Chol Calc (NIH): 64 mg/dL (ref 0–99)
Lymphocytes Absolute: 2.2 10*3/uL (ref 0.7–3.1)
Lymphs: 31 %
MCH: 30 pg (ref 26.6–33.0)
MCHC: 34.1 g/dL (ref 31.5–35.7)
MCV: 88 fL (ref 79–97)
Monocytes Absolute: 0.4 10*3/uL (ref 0.1–0.9)
Monocytes: 6 %
Neutrophils Absolute: 4.2 10*3/uL (ref 1.4–7.0)
Neutrophils: 61 %
Phosphorus: 3.3 mg/dL (ref 2.8–4.1)
Platelets: 198 10*3/uL (ref 150–450)
Potassium: 4.8 mmol/L (ref 3.5–5.2)
Prostate Specific Ag, Serum: 1.2 ng/mL (ref 0.0–4.0)
RBC: 4.93 x10E6/uL (ref 4.14–5.80)
RDW: 13.9 % (ref 11.6–15.4)
Sodium: 138 mmol/L (ref 134–144)
T3 Uptake Ratio: 29 % (ref 24–39)
T4, Total: 9 ug/dL (ref 4.5–12.0)
TSH: 0.765 u[IU]/mL (ref 0.450–4.500)
Total Protein: 6.8 g/dL (ref 6.0–8.5)
Triglycerides: 96 mg/dL (ref 0–149)
Uric Acid: 4.7 mg/dL (ref 3.8–8.4)
VLDL Cholesterol Cal: 18 mg/dL (ref 5–40)
WBC: 7 10*3/uL (ref 3.4–10.8)

## 2020-05-31 ENCOUNTER — Telehealth: Payer: Self-pay | Admitting: Internal Medicine

## 2020-05-31 ENCOUNTER — Encounter (INDEPENDENT_AMBULATORY_CARE_PROVIDER_SITE_OTHER): Payer: Self-pay | Admitting: Vascular Surgery

## 2020-05-31 ENCOUNTER — Encounter (INDEPENDENT_AMBULATORY_CARE_PROVIDER_SITE_OTHER): Payer: Self-pay

## 2020-05-31 NOTE — Telephone Encounter (Signed)
Patient due for annual follow-up with me next month.  We can discuss vascular referral at that time.  Yvonne Kendall, MD [facility]

## 2020-05-31 NOTE — Telephone Encounter (Signed)
Patient would like a referral to a vascular doctor in the [place] area. Please call to discuss.

## 2020-05-31 NOTE — Telephone Encounter (Signed)
Spoke with wife .  She is aware and prefers patient to call due to schedule.    Closing encounter .  Recall in place.

## 2020-05-31 NOTE — Telephone Encounter (Signed)
Please schedule patient for follow up and let him know Dr End will discuss with him at that time. Thanks!

## 2020-06-03 ENCOUNTER — Encounter: Payer: Self-pay | Admitting: Emergency Medicine

## 2020-06-03 ENCOUNTER — Ambulatory Visit: Payer: Self-pay | Admitting: Emergency Medicine

## 2020-06-03 ENCOUNTER — Other Ambulatory Visit: Payer: Self-pay

## 2020-06-03 VITALS — BP 117/73 | HR 56 | Temp 97.7°F | Resp 12 | Ht 69.5 in | Wt 163.0 lb

## 2020-06-03 DIAGNOSIS — Z0000 Encounter for general adult medical examination without abnormal findings: Secondary | ICD-10-CM

## 2020-06-03 MED ORDER — CLONAZEPAM 1 MG PO TABS: 1.0000 mg | ORAL_TABLET | Freq: Two times a day (BID) | ORAL | 2 refills | Status: DC | PRN

## 2020-06-03 MED ORDER — ZOLPIDEM TARTRATE 10 MG PO TABS: 10.0000 mg | ORAL_TABLET | Freq: Every evening | ORAL | 2 refills | Status: DC | PRN

## 2020-06-03 NOTE — Progress Notes (Signed)
I have reviewed the triage vital signs and the nursing notes.   HISTORY  Chief Complaint Annual Exam   HPI Paul Koch is a 60 y.o. male is here for annual physical.  No current complaints.       Past Medical History:  Diagnosis Date  . AAA (abdominal aortic aneurysm) (HCC)   . Anxiety disorder   . CAD (coronary artery disease)   . COPD (chronic obstructive pulmonary disease) (HCC)   . Eczema   . ED (erectile dysfunction)   . Elevated lipids   . Hyperplastic colon polyp 2014   due 2024  . Hypertension   . Lung nodule     Patient Active Problem List   Diagnosis Date Noted  . Non-functional thyroid nodule 11/17/2018  . Sinus bradycardia 02/09/2018  . Pain in both lower extremities 02/10/2017  . Coronary artery disease 05/14/2016  . Hyperlipidemia LDL goal <70 05/14/2016  . Arteriosclerosis of abdominal aorta (HCC) 05/14/2016  . Solitary lung nodule 05/14/2016  . Abnormal stress test   . 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 ECZEMA 08/06/2008  . DERMATOPHYTOSIS OF NAIL 07/14/2007  . INGROWING NAIL 07/14/2007  . ANXIETY DISORDER 06/02/2007  . Essential hypertension 06/02/2007    Past Surgical History:  Procedure Laterality Date  . CARDIAC CATHETERIZATION N/A 01/30/2016   Procedure: Left Heart Cath and Coronary Angiography;  Surgeon: Corky Crafts, MD;  Location: [facility] INVASIVE CV LAB;  Service: Cardiovascular;  Laterality: N/A;  . CARDIAC CATHETERIZATION N/A 01/30/2016   Procedure: Coronary Stent Intervention;  Surgeon: Corky Crafts, MD;  Location: [facility] INVASIVE CV LAB;  Service: Cardiovascular;  Laterality: N/A;  . TONSILLECTOMY AND ADENOIDECTOMY    . VASECTOMY      Prior to Admission medications   Medication Sig Start Date End Date Taking? Authorizing Provider  amLODipine (NORVASC) 5 MG tablet Take 1 tablet (5 mg total) by mouth 2 (two) times daily. 08/24/19  Yes Emily Filbert, MD  ASPIRIN LOW DOSE 81 MG EC tablet  TAKE 1 TABLET BY MOUTH EVERY DAY 03/20/20  Yes Joni Reining, PA-C  atorvastatin (LIPITOR) 20 MG tablet Take 1 tablet by mouth daily 08/24/19  Yes Emily Filbert, MD  clonazePAM (KLONOPIN) 1 MG tablet Take 1 tablet (1 mg total) by mouth 2 (two) times daily as needed for anxiety. 06/03/20  Yes Bridget Hartshorn L, PA-C  FLUoxetine (PROZAC) 20 MG tablet TAKE 3 TABLETS BY MOUTH EVERY DAY 12/28/19  Yes Joni Reining, PA-C  loratadine (CLARITIN) 10 MG tablet Take 1 tablet (10 mg total) by mouth daily as needed for allergies. 08/24/19  Yes Emily Filbert, MD  metoprolol tartrate (LOPRESSOR) 25 MG tablet TAKE 1 TABLET BY MOUTH TWICE A DAY 10/10/19  Yes Emily Filbert, MD  ramipril (ALTACE) 10 MG capsule Take 1 capsule (10 mg total) by mouth 2 (two) times daily. 10/10/19  Yes Emily Filbert, MD  sildenafil (VIAGRA) 25 MG tablet Take 1 tablet (25 mg total) by mouth daily as needed for erectile dysfunction. 08/24/19  Yes Emily Filbert, MD  zolpidem (AMBIEN) 10 MG tablet Take 1 tablet (10 mg total) by mouth at bedtime as needed for sleep. 06/03/20 07/03/20 Yes Tommi Rumps, PA-C    Allergies Clindamycin hcl  Family History  Problem Relation Age of Onset  . Brain cancer Mother   . Lung cancer Mother   . Arrhythmia Mother   . Hypertension Mother   . Hyperlipidemia  Mother   . Bladder Cancer Mother   . Stroke Mother   . Emphysema Father     Social History Social History   Tobacco Use  . Smoking status: Former Smoker    Packs/day: 1.00    Years: 20.00    Pack years: 20.00    Types: Cigarettes    Quit date: 2004    Years since quitting: 18.1  . Smokeless tobacco: Never Used  Substance Use Topics  . Alcohol use: No    Comment: social (5 drinks/year)  . Drug use: No    Review of Systems Constitutional: No fever/chills Eyes: No visual changes. ENT: No sore throat. Cardiovascular: Denies chest pain. Respiratory: Denies shortness of breath. Gastrointestinal: No  abdominal pain.  No nausea, no vomiting.  No diarrhea. Musculoskeletal: Negative for muscle skeletal pain. Skin: Negative for rash. Neurological: Negative for headaches, focal weakness or numbness.  ____________________________________________   PHYSICAL EXAM: Constitutional: Alert and oriented. Well appearing and in no acute distress. Eyes: Conjunctivae are normal.  Head: Atraumatic. Nose: No congestion/rhinnorhea. Neck: No stridor.  Cardiovascular: Normal rate, regular rhythm. Grossly normal heart sounds.  Good peripheral circulation. Respiratory: Normal respiratory effort.  No retractions. Lungs CTAB. Gastrointestinal: Soft and nontender. No distention. Musculoskeletal: Moves upper and lower extremities without any difficulty.  No tenderness is noted on palpation of the cervical, thoracic or lumbar spine.  No tenderness on palpation of the joints and no edema noted in lower extremities. Neurologic:  Normal speech and language. No gross focal neurologic deficits are appreciated. No gait instability. Skin:  Skin is warm, dry and intact. No rash noted. Psychiatric: Mood and affect are normal. Speech and behavior are normal.  ____________________________________________   LABS (all labs ordered are listed, but only abnormal results are displayed)  Reviewed ____________________________________________  EKG  Sinus bradycardia with ventricular rate of 51. ____________________________________________  PROCEDURES  Procedure(s) performed (including Critical Care):  Procedures   ____________________________________________   INITIAL IMPRESSION / ASSESSMENT AND PLAN   Patient does request that his prescriptions be changed as he takes his Klonopin 1 mg twice daily and his last prescription was written for 20 and also his Ambien was written for 15.  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  Lab work was discussed and patient was made aware that his prescriptions would be sent to his pharmacy. ____________________________________________   FINAL CLINICAL IMPRESSION(S) /  Annual physical exam.   ED Discharge Orders         Ordered    EKG 12-Lead        06/03/20 0901    POCT urinalysis dipstick  Status:  Canceled        06/03/20 0901    zolpidem (AMBIEN) 10 MG tablet  At bedtime PRN        06/03/20 1004    clonazePAM (KLONOPIN) 1 MG tablet  2 times daily PRN        06/03/20 1004          Note:  This document was prepared using Dragon voice recognition software and may include unintentional dictation errors.

## 2020-06-04 NOTE — Addendum Note (Signed)
Addended by: Lanny Hurst on: 06/04/2020 03:21 PM   Modules accepted: Orders

## 2020-06-04 NOTE — Telephone Encounter (Signed)
Per referral message pt states that he would like a vascular referral to an office in [place] area.  Order placed for vascular referral to [facility]. Called pt and notified new referral placed per his request and that he should receive a call to schedule. Pt appreciative and states he has no further needs at this time.

## 2020-06-06 NOTE — Telephone Encounter (Signed)
Order sent to my box to approve a vascular surgery referral.  On review of recent notes, it appears Dr. Okey Dupre wished to discuss this at his next follow-up (telephone note 05/31/20).  Can we clarify if Dr. Okey Dupre now recommends referral? Will Cc' him on this note.

## 2020-06-07 NOTE — Telephone Encounter (Signed)
Ok for him to be seen at by [facility] in [place] for ongoing f/u of his AAA.  Yvonne Kendall, MD [facility]

## 2020-06-23 NOTE — Progress Notes (Signed)
Cardiology Office Note:    Date:  06/24/2020   ID:  Paul Koch, DOB [birth year], MRN [id number]  PCP:  Patient, No Pcp Per  [facility] Cardiologist:  Paul Kendall, MD  [facility] Electrophysiologist:  None   Referring MD: No ref. provider found   Chief Complaint: 12 month follow-up  History of Present Illness:    Paul Koch is a 60 y.o. male with a hx of CAD s/p PCI to [facility] 01/2016, HTN, AAA, HLD, bradycardia on BB, anixiety, solitary lung nodule (33mm) monitored by CT, thryoid nodule with recommendation forbx deferred, and remote smoking history who presents for CAD follow-up.  He underwent MPI 01/2016 tht showed moderate size basal inferior septal, mid, inf, septal, mid inf and apical defect consistend with iscemia or scar. Treadmill test showed ST depression in the inferior leads. LVEF 65%. He underwent LHC and had PCI 01/2016. He was treated with DES to [facility]. LVEF 55-65%. He underwent abdominal US 03/2016 showed atherosclerosis plaque of the abdominal aorta at the upper limits, 2.9cm with most recent 2019 showed 3.1cm.   He was last seen in the office 06/12/19 and was doing well from a cardiac standpoint. AAA study was scheduled.   Today, the patient reports he is overall doing well. Says BP sometimes is higher when he is stressed or upset. A little elevated today as well. He rarely takes his BP at home. Has chronic short of breath, worse when he walks long distances or up stairs. He denies chest pain. Also has some leg pain, it has been going on for the last 5 years. Worse with longer distances. Has trace lower leg edema and he says this is unchanged. He doesn't wear compression stockings. Reports he eats a lot of salt. Denies orthopnea, pnd, nasuea, vomiting, fever, chills. Has rare orthostatic symptoms. He has to pause before walking.    Past Medical History:  Diagnosis Date  . AAA (abdominal aortic aneurysm) (HCC)   . Anxiety disorder   . CAD (coronary artery disease)    . COPD (chronic obstructive pulmonary disease) (HCC)   . Eczema   . ED (erectile dysfunction)   . Elevated lipids   . Hyperplastic colon polyp 2014   due 2024  . Hypertension   . Lung nodule     Past Surgical History:  Procedure Laterality Date  . CARDIAC CATHETERIZATION N/A 01/30/2016   Procedure: Left Heart Cath and Coronary Angiography;  Surgeon: Corky Crafts, MD;  Location: [facility] INVASIVE CV LAB;  Service: Cardiovascular;  Laterality: N/A;  . CARDIAC CATHETERIZATION N/A 01/30/2016   Procedure: Coronary Stent Intervention;  Surgeon: Corky Crafts, MD;  Location: [facility]  INVASIVE CV LAB;  Service: Cardiovascular;  Laterality: N/A;  . TONSILLECTOMY AND ADENOIDECTOMY    . VASECTOMY      Current Medications: Current Meds  Medication Sig  . amLODipine (NORVASC) 5 MG tablet Take 1 tablet (5 mg total) by mouth 2 (two) times daily.  . ASPIRIN LOW DOSE 81 MG EC tablet TAKE 1 TABLET BY MOUTH EVERY DAY  . atorvastatin (LIPITOR) 20 MG tablet Take 1 tablet by mouth daily  . clonazePAM (KLONOPIN) 1 MG tablet Take 1 tablet (1 mg total) by mouth 2 (two) times daily as needed for anxiety.  Marland Kitchen FLUoxetine (PROZAC) 20 MG tablet TAKE 3 TABLETS BY MOUTH EVERY DAY  . loratadine (CLARITIN) 10 MG tablet Take 1 tablet (10 mg total) by mouth daily as needed for allergies.  . metoprolol tartrate (LOPRESSOR) 25 MG  tablet TAKE 1 TABLET BY MOUTH TWICE A DAY  . ramipril (ALTACE) 10 MG capsule Take 1 capsule (10 mg total) by mouth 2 (two) times daily.  . sildenafil (VIAGRA) 25 MG tablet Take 1 tablet (25 mg total) by mouth daily as needed for erectile dysfunction.  Marland Kitchen zolpidem (AMBIEN) 10 MG tablet Take 1 tablet (10 mg total) by mouth at bedtime as needed for sleep.     Allergies:   Clindamycin hcl   Social History   Socioeconomic History  . Marital status: Married    Spouse name: Not on file  . Number of children: Not on file  . Years of education: Not on file  . Highest education level: Not on  file  Occupational History  . Not on file  Tobacco Use  . Smoking status: Former Smoker    Packs/day: 1.00    Years: 20.00    Pack years: 20.00    Types: Cigarettes    Quit date: 2004    Years since quitting: 18.2  . Smokeless tobacco: Never Used  Substance and Sexual Activity  . Alcohol use: No    Comment: social (5 drinks/year)  . Drug use: No  . Sexual activity: Not on file  Other Topics Concern  . Not on file  Social History Narrative  . Not on file   Social Determinants of Health   Financial Resource Strain: Not on file  Food Insecurity: Not on file  Transportation Needs: Not on file  Physical Activity: Not on file  Stress: Not on file  Social Connections: Not on file     Family History: The patient's family history includes Arrhythmia in his mother; Bladder Cancer in his mother; Brain cancer in his mother; Emphysema in his father; Hyperlipidemia in his mother; Hypertension in his mother; Lung cancer in his mother; Stroke in his mother.  ROS:   Please see the history of present illness.     All other systems reviewed and are negative.  EKGs/Labs/Other Studies Reviewed:    The following studies were reviewed today:  Echo 12/2019  1. Left ventricular ejection fraction, by estimation, is 55 to 60%. The  left ventricle has normal function. The left ventricle has no regional  wall motion abnormalities. Left ventricular diastolic parameters were  normal.  2. Right ventricular systolic function is normal. The right ventricular  size is normal.  3. The mitral valve is normal in structure. No evidence of mitral valve  regurgitation. No evidence of mitral stenosis.  4. The aortic valve is normal in structure. Aortic valve regurgitation is  not visualized. No aortic stenosis is present.  5. The inferior vena cava is normal in size with greater than 50%  respiratory variability, suggesting right atrial pressure of 3 mmHg.   Cardiac cath 2017   Mid RCA lesion,  90 %stenosed.  A STENT PROMUS PREM MR [id number] drug eluting stent was successfully placed, postdilated to 3.8 mm.  Post intervention, there is a 0% residual stenosis.  Mild, scattered plaque in the left sided vessels.  The left ventricular systolic function is normal. LV end diastolic pressure is mildly elevated, LVEDP 20 mm Hg.  The left ventricular ejection fraction is 55-65% by visual estimate.  There is no aortic valve stenosis.   Continue dual antiplatelet therapy along with aggressive secondary prevention.  Candidate for same day PCI.  He will need plavix supply before he leaves the hospital.  Coronary Diagrams   Diagnostic Dominance: Right    Intervention  EKG:  EKG is  ordered today.  The ekg ordered today demonstrates SB, 56bpm, LAFB, LAD  Recent Labs: 05/28/2020: ALT 16; BUN 11; Creatinine, Ser 0.95; Hemoglobin 14.8; Platelets 198; Potassium 4.8; Sodium 138; TSH 0.765  Recent Lipid Panel    Component Value Date/Time   CHOL 115 05/28/2020 0814   TRIG 96 05/28/2020 0814   HDL 33 (L) 05/28/2020 0814   CHOLHDL 3.5 05/28/2020 0814   CHOLHDL 5 07/31/2008 0958   VLDL 32.6 07/31/2008 0958   LDLCALC 64 05/28/2020 0814     Physical Exam:    VS:  BP 140/78 (BP Location: Left Arm, Patient Position: Sitting, Cuff Size: Normal)   Pulse (!) 56   Ht 5\' 9"  (1.753 m)   Wt 163 lb (73.9 kg)   SpO2 98%   BMI 24.07 kg/m     Wt Readings from Last 3 Encounters:  06/24/20 163 lb (73.9 kg)  06/03/20 163 lb (73.9 kg)  04/30/20 161 lb (73 kg)     GEN:  Well nourished, well developed in no acute distress HEENT: Normal NECK: No JVD; No carotid bruits LYMPHATICS: No lymphadenopathy CARDIAC: SB, no murmurs, rubs, gallops RESPIRATORY:  Clear to auscultation without rales, wheezing or rhonchi  ABDOMEN: Soft, non-tender, non-distended MUSCULOSKELETAL:  Trace bilateral edema; No deformity  SKIN: Warm and dry NEUROLOGIC:  Alert and oriented x 3 PSYCHIATRIC:  Normal  affect   ASSESSMENT:    1. Coronary artery disease involving native coronary artery of native heart without angina pectoris   2. Hyperlipidemia LDL goal <70   3. Essential hypertension   4. Sinus bradycardia   5. Aortic aneurysm without rupture, unspecified portion of aorta (HCC)    PLAN:    In order of problems listed above:   CAD s/p DES mRCA in 2017 Denies chest pain, has chronic sob from COPD. Continue BB, Aspirin, statin. Denies any bleeding symptoms with Aspirin.   HLD LDL 64 05/2020. Continue atorvastatin.   AAA Referred to Vascular for follow-up. Due for follow-up imaging in 1 year. He says he plans on calling [facility] to establish care.  HTN BP a little bit elevated today, but a month ago it was normal. He is at max dose of ramipril and amlodipine, also has trace lower leg edema. Cannot titrate BB due to SB. It seems blood pressures is worse when he is upset, stressed, or with COPD flares up. He also eats a lot of salt. I recommended he decrease salt intake. Check Bps for a week. Will reassess at follow-up in 1 month. If bp still high can consider chlorthalidone or HCTZ. Conitnue Amlodipine, metoprolol, ramipril. Recent labs show stable kidney function.  Sinus bradycardia Heart rate 56bpm on EKG today. Continue metoprolol. Reports rare dizziness, lightheadedness. Informed the patient to call 06/2020 if symptoms worsen  Leg pain Reports claudication symptoms bilaterally. Check bilateral ABIs.  Trace lower leg swelling He is on max dose Amlodipine and eats lots of salt. Echo in 12/2019 showed normal LV function and diastolic function. Recommended decreasing salt intake and compression socks. Can consider diuretic as above if BP still elevated. Might have to d/c CCB.    Disposition: Follow up in 1 month(s) with APP     Signed, Maleeha Halls 01/2020, PA-C  06/24/2020 8:28 AM    [facility]

## 2020-06-24 ENCOUNTER — Encounter: Payer: Self-pay | Admitting: Medical

## 2020-06-24 ENCOUNTER — Other Ambulatory Visit: Payer: Self-pay

## 2020-06-24 ENCOUNTER — Ambulatory Visit (INDEPENDENT_AMBULATORY_CARE_PROVIDER_SITE_OTHER): Payer: 59 | Admitting: Medical

## 2020-06-24 VITALS — BP 140/78 | HR 56 | Ht 69.0 in | Wt 163.0 lb

## 2020-06-24 DIAGNOSIS — I719 Aortic aneurysm of unspecified site, without rupture: Secondary | ICD-10-CM

## 2020-06-24 DIAGNOSIS — R001 Bradycardia, unspecified: Secondary | ICD-10-CM | POA: Diagnosis not present

## 2020-06-24 DIAGNOSIS — I2510 Atherosclerotic heart disease of native coronary artery without angina pectoris: Secondary | ICD-10-CM

## 2020-06-24 DIAGNOSIS — M79606 Pain in leg, unspecified: Secondary | ICD-10-CM

## 2020-06-24 DIAGNOSIS — E785 Hyperlipidemia, unspecified: Secondary | ICD-10-CM

## 2020-06-24 DIAGNOSIS — I10 Essential (primary) hypertension: Secondary | ICD-10-CM | POA: Diagnosis not present

## 2020-06-24 NOTE — Patient Instructions (Addendum)
Medication Instructions:  Continue your current medications.   *If you need a refill on your cardiac medications before your next appointment, please call your pharmacy*  Lab Work: None ordered today.   Testing/Procedures: Your physician has requested that you have an ankle brachial index (ABI). During this test an ultrasound and blood pressure cuff are used to evaluate the arteries that supply the arms and legs with blood. Allow thirty minutes for this exam. There are no restrictions or special instructions  Follow-Up: At [facility], you and your health needs are our priority.  As part of our continuing mission to provide you with exceptional heart care, we have created designated Provider Care Teams.  These Care Teams include your primary Cardiologist (physician) and Advanced Practice Providers (APPs -  Physician Assistants and Nurse Practitioners) who all work together to provide you with the care you need, when you need it.   Your next appointment:   1 month(s)  The format for your next appointment:   In Person  Provider:   You may see Yvonne Kendall, MD or one of the following Advanced Practice Providers on your designated Care Team:    Nicolasa Ducking, NP  Eula Listen, PA-C  Marisue Ivan, PA-C  Cadence Fransico Michael, [place]  Gillian Shields, NP  Other Instructions   Please contact [facility] to schedule an appointment for following of your AAA. Their phone number is [phone].    Recommend a low salt diet.    Please check your blood pressure once per day at home and keep a log. Bring your blood pressure log as well as your cuff to your next appointment.   Tips to Measure your Blood Pressure Correctly  Here's what you can do to ensure a correct reading:  Don't drink a caffeinated beverage or smoke during the 30 minutes before the test.  Sit quietly for five minutes before the test begins.  During the measurement, sit in a chair  with your feet on the floor and your arm supported so your elbow is at about heart level.  The inflatable part of the cuff should completely cover at least 80% of your upper arm, and the cuff should be placed on bare skin, not over a shirt.  Don't talk during the measurement.  Have your blood pressure measured twice, with a brief break in between. If the readings are different by 5 points or more, have it done a third time.  In 2017, new guidelines from the American Heart Association, the [employer] of Cardiology, and nine other health organizations lowered the diagnosis of high blood pressure to 130/80 mm Hg or higher for all adults. The guidelines also redefined the various blood pressure categories to now include normal, elevated, Stage 1 hypertension, Stage 2 hypertension, and hypertensive crisis (see "Blood pressure categories").  Blood pressure categories  Blood pressure category SYSTOLIC (upper number)  DIASTOLIC (lower number)  Normal Less than 120 mm Hg and Less than 80 mm Hg  Elevated 120-129 mm Hg and Less than 80 mm Hg  High blood pressure: Stage 1 hypertension 130-139 mm Hg or 80-89 mm Hg  High blood pressure: Stage 2 hypertension 140 mm Hg or higher or 90 mm Hg or higher  Hypertensive crisis (consult your doctor immediately) Higher than 180 mm Hg and/or Higher than 120 mm Hg  Source: American Heart Association and American Stroke Association. For more on getting your blood pressure under control, buy Controlling Your Blood Pressure, a Special Health  Report from [facility].   Blood Pressure Log   Date   Time  Blood Pressure  Position  Example: Nov 1 9 AM 124/78 sitting

## 2020-07-11 ENCOUNTER — Ambulatory Visit: Payer: 59

## 2020-07-17 ENCOUNTER — Other Ambulatory Visit: Payer: Self-pay | Admitting: Physician Assistant

## 2020-07-17 DIAGNOSIS — F411 Generalized anxiety disorder: Secondary | ICD-10-CM

## 2020-07-25 ENCOUNTER — Encounter: Payer: Self-pay | Admitting: Vascular Surgery

## 2020-07-25 ENCOUNTER — Ambulatory Visit (INDEPENDENT_AMBULATORY_CARE_PROVIDER_SITE_OTHER): Payer: 59 | Admitting: Vascular Surgery

## 2020-07-25 ENCOUNTER — Other Ambulatory Visit: Payer: Self-pay

## 2020-07-25 VITALS — BP 131/82 | HR 57 | Temp 97.7°F | Resp 20 | Ht 69.0 in | Wt 163.0 lb

## 2020-07-25 DIAGNOSIS — I714 Abdominal aortic aneurysm, without rupture, unspecified: Secondary | ICD-10-CM

## 2020-07-25 NOTE — Progress Notes (Signed)
Referring Physician: Dr. Okey Dupre Patient name: Paul Koch MRN: [id number] DOB: 03-30-1961 Sex: male  REASON FOR CONSULT: Nominal aortic aneurysm  HPI: Paul Koch is a 60 y.o. male, with approximately 4-year history of known abdominal aortic aneurysm.  He states that when it was first found it was around 3-1/2 cm in diameter.  He had an ultrasound in December 2021 which showed the aortic diameter was 3.7 cm diameter.  He does not describe abdominal or back pain.  He still works actively at a [employer].  He is on an aspirin and a statin.  He had a coronary stent placed approximately 8 years ago but currently does not have chest pain or shortness of breath.  He quit smoking many years ago.  He has no family history of abdominal aortic aneurysm.  He does not describe claudication symptoms.  He has no symptoms of TIA amaurosis or stroke.  Past Medical History:  Diagnosis Date  . AAA (abdominal aortic aneurysm) (HCC)   . Anxiety disorder   . CAD (coronary artery disease)   . COPD (chronic obstructive pulmonary disease) (HCC)   . Eczema   . ED (erectile dysfunction)   . Elevated lipids   . Hyperplastic colon polyp 2014   due 2024  . Hypertension   . Lung nodule    Past Surgical History:  Procedure Laterality Date  . CARDIAC CATHETERIZATION N/A 01/30/2016   Procedure: Left Heart Cath and Coronary Angiography;  Surgeon: Corky Crafts, MD;  Location[facility] INVASIVE CV LAB;  Service: Cardiovascular;  Laterality: N/A;  . CARDIAC CATHETERIZATION N/A 01/30/2016   Procedure: Coronary Stent Intervention;  Surgeon: Corky Crafts, MD;  Location[facility] INVASIVE CV LAB;  Service: Cardiovascular;  Laterality: N/A;  . TONSILLECTOMY AND ADENOIDECTOMY    . VASECTOMY      Family History  Problem Relation Age of Onset  . Brain cancer Mother   . Lung cancer Mother   . Arrhythmia Mother   . Hypertension Mother   . Hyperlipidemia Mother   . Bladder Cancer Mother   . Stroke Mother    . Emphysema Father     SOCIAL HISTORY: Social History   Socioeconomic History  . Marital status: Married    Spouse name: Not on file  . Number of children: Not on file  . Years of education: Not on file  . Highest education level: Not on file  Occupational History  . Not on file  Tobacco Use  . Smoking status: Former Smoker    Packs/day: 1.00    Years: 20.00    Pack years: 20.00    Types: Cigarettes    Quit date: 2004    Years since quitting: 18.3  . Smokeless tobacco: Never Used  Substance and Sexual Activity  . Alcohol use: No    Comment: social (5 drinks/year)  . Drug use: No  . Sexual activity: Not on file  Other Topics Concern  . Not on file  Social History Narrative  . Not on file   Social Determinants of Health   Financial Resource Strain: Not on file  Food Insecurity: Not on file  Transportation Needs: Not on file  Physical Activity: Not on file  Stress: Not on file  Social Connections: Not on file  Intimate Partner Violence: Not on file    Allergies  Allergen Reactions  . Clindamycin Hcl     REACTION: stomach upset    Current Outpatient Medications  Medication Sig Dispense Refill  .  amLODipine (NORVASC) 5 MG tablet Take 1 tablet (5 mg total) by mouth 2 (two) times daily. 180 tablet 6  . ASPIRIN LOW DOSE 81 MG EC tablet TAKE 1 TABLET BY MOUTH EVERY DAY 90 tablet 2  . atorvastatin (LIPITOR) 20 MG tablet Take 1 tablet by mouth daily 90 tablet 6  . clonazePAM (KLONOPIN) 1 MG tablet Take 1 tablet (1 mg total) by mouth 2 (two) times daily as needed for anxiety. 60 tablet 2  . FLUoxetine (PROZAC) 20 MG tablet TAKE 3 TABLETS BY MOUTH EVERY DAY 270 tablet 2  . loratadine (CLARITIN) 10 MG tablet Take 1 tablet (10 mg total) by mouth daily as needed for allergies. 90 tablet 6  . metoprolol tartrate (LOPRESSOR) 25 MG tablet TAKE 1 TABLET BY MOUTH TWICE A DAY 180 tablet 6  . ramipril (ALTACE) 10 MG capsule Take 1 capsule (10 mg total) by mouth 2 (two) times  daily. 180 capsule 6  . sildenafil (VIAGRA) 25 MG tablet Take 1 tablet (25 mg total) by mouth daily as needed for erectile dysfunction. 10 tablet 1  . zolpidem (AMBIEN) 10 MG tablet Take 1 tablet (10 mg total) by mouth at bedtime as needed for sleep. 30 tablet 2   No current facility-administered medications for this visit.    ROS:   General:  No weight loss, Fever, chills  HEENT: No recent headaches, no nasal bleeding, no visual changes, no sore throat  Neurologic: No dizziness, blackouts, seizures. No recent symptoms of stroke or mini- stroke. No recent episodes of slurred speech, or temporary blindness.  Cardiac: No recent episodes of chest pain/pressure, no shortness of breath at rest.  No shortness of breath with exertion.  Denies history of atrial fibrillation or irregular heartbeat  Vascular: No history of rest pain in feet.  No history of claudication.  No history of non-healing ulcer, No history of DVT   Pulmonary: No home oxygen, no productive cough, no hemoptysis,  No asthma or wheezing  Musculoskeletal:  [ ]  Arthritis, [ ]  Low back pain,  [ ]  Joint pain  Hematologic:No history of hypercoagulable state.  No history of easy bleeding.  No history of anemia  Gastrointestinal: No hematochezia or melena,  No gastroesophageal reflux, no trouble swallowing  Urinary: [ ]  chronic Kidney disease, [ ]  on HD - [ ]  MWF or [ ]  TTHS, [ ]  Burning with urination, [ ]  Frequent urination, [ ]  Difficulty urinating;   Skin: No rashes  Psychological: No history of anxiety,  No history of depression   Physical Examination   Vitals:   07/25/20 0817  BP: 131/82  Pulse: (!) 57  Resp: 20  Temp: 97.7 F (36.5 C)  SpO2: 94%  Weight: 163 lb (73.9 kg)  Height: 5\' 9"  (1.753 m)    General:  Alert and oriented, no acute distress HEENT: Normal Neck: No JVD Cardiac: Regular Rate and Rhythm Abdomen: Soft, non-tender, non-distended, no mass, no scars Skin: No rash Extremity Pulses:  2+  radial, brachial, femoral, absent dorsalis pedis bilaterally, 2+ right popliteal absent left popliteal 2+ right posterior tibial pulse, 1+ left posterior tibial pulse Musculoskeletal: No deformity or edema  Neurologic: Upper and lower extremity motor 5/5 and symmetric  DATA:  aorta 12/21 AAA 3.7 cm diameter  ASSESSMENT: 3.7 cm abdominal aortic aneurysm consideration for repair if this reaches 5 and half centimeter in diameter.  Patient does have an abnormal pulse exam and has a lower extremity arterial duplex scheduled by for  May.  However, he is asymptomatic with no claudication symptoms.   PLAN: Patient will follow up in our APP clinic in 1 year with repeat aortic ultrasound.  If his lower extremity arterial duplex shows any significant occlusive disease we could consider whether or not surveillance will be necessary.   Fabienne Bruns, MD [facility] Office: [phone]

## 2020-07-31 ENCOUNTER — Ambulatory Visit: Payer: 59 | Admitting: Medical

## 2020-08-05 ENCOUNTER — Other Ambulatory Visit: Payer: Self-pay

## 2020-08-05 DIAGNOSIS — F411 Generalized anxiety disorder: Secondary | ICD-10-CM

## 2020-08-05 MED ORDER — CLONAZEPAM 1 MG PO TABS: 1.0000 mg | ORAL_TABLET | Freq: Two times a day (BID) | ORAL | 2 refills | Status: DC | PRN

## 2020-08-07 ENCOUNTER — Other Ambulatory Visit: Payer: Self-pay | Admitting: Medical

## 2020-08-07 DIAGNOSIS — I739 Peripheral vascular disease, unspecified: Secondary | ICD-10-CM

## 2020-08-09 ENCOUNTER — Ambulatory Visit (INDEPENDENT_AMBULATORY_CARE_PROVIDER_SITE_OTHER): Payer: 59

## 2020-08-09 ENCOUNTER — Other Ambulatory Visit: Payer: Self-pay

## 2020-08-09 DIAGNOSIS — I739 Peripheral vascular disease, unspecified: Secondary | ICD-10-CM

## 2020-08-19 ENCOUNTER — Ambulatory Visit: Payer: 59 | Admitting: Medical

## 2020-08-19 NOTE — Progress Notes (Deleted)
Cardiology Office Note:    Date:  08/19/2020   ID:  Rosine Beat Hennes, DOB Mar 27, 1961, MRN [id number]  PCP:  Wells Guiles  [facility] Cardiologist:  Yvonne Kendall, MD  [facility] Electrophysiologist:  None   Referring MD: No ref. provider found   Chief Complaint: 1 month follow-up  History of Present Illness:    Paul Koch is a 60 y.o. 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  In 2017 he underwent MPI which showed moderate size apical defect consistent with ischemia or scar.  Treadmill test showed ST depressions in the inferior leads.  LVEF estimated 65%.  He underwent cardiac cath and had PCI DES to M RCA.  LVEF 55 to 60%.  He underwent abdominal ultrasound 03/2016 showing atherosclerosis plaque of the abdominal aorta at the upper limits, 2.9 cm with most recent scan in 2019 showing 3.1 cm  Last seen 06/24/2020 and blood pressure was elevated.  Lifestyle changes were recommended and he was planned to be seen back in a month.  Also had some lower leg swelling, might need diuretic at follow-up.  Today,  Past Medical History:  Diagnosis Date  . AAA (abdominal aortic aneurysm) (HCC)   . Anxiety disorder   . CAD (coronary artery disease)   . COPD (chronic obstructive pulmonary disease) (HCC)   . Eczema   . ED (erectile dysfunction)   . Elevated lipids   . Hyperplastic colon polyp 2014   due 2024  . Hypertension   . Lung nodule     Past Surgical History:  Procedure Laterality Date  . CARDIAC CATHETERIZATION N/A 01/30/2016   Procedure: Left Heart Cath and Coronary Angiography;  Surgeon: Corky Crafts, MD;  Location: [facility] INVASIVE CV LAB;  Service: Cardiovascular;  Laterality: N/A;  . CARDIAC CATHETERIZATION N/A 01/30/2016   Procedure: Coronary Stent Intervention;  Surgeon: Corky Crafts, MD;  Location: [facility] INVASIVE CV LAB;  Service: Cardiovascular;  Laterality: N/A;  . TONSILLECTOMY AND ADENOIDECTOMY    . VASECTOMY      Current Medications: No outpatient medications have been marked as taking for the 08/19/20 encounter (Appointment) with Fransico Michael, Lometa Riggin H, PA-C.     Allergies:   Clindamycin hcl   Social History   Socioeconomic History  . Marital status: Married    Spouse name: Not on file  . Number of children: Not on file  . Years of education: Not on file  . Highest education level: Not on file  Occupational History  . Not on file  Tobacco Use  . Smoking status: Former Smoker    Packs/day: 1.00    Years: 20.00    Pack years: 20.00    Types: Cigarettes    Quit date: 2004    Years since quitting: 18.3  . Smokeless tobacco: Never Used  Vaping Use  . Vaping Use: Never used  Substance and Sexual Activity  . Alcohol use: No    Comment: social (5 drinks/year)  . Drug use: No  . Sexual activity: Not on file  Other Topics Concern  . Not on file  Social History Narrative  . Not on file   Social Determinants of Health   Financial Resource Strain: Not on file  Food Insecurity: Not on file  Transportation Needs: Not on file  Physical Activity: Not on file  Stress: Not on file  Social Connections: Not on file     Family History: The patient's ***family history includes Arrhythmia in his mother; Bladder Cancer in his mother; Brain cancer in his mother; Emphysema in his father; Hyperlipidemia in his mother; Hypertension in his mother; Lung cancer in his mother; Stroke in his mother.  ROS:   Please see the history of present illness.    *** All other systems reviewed and are negative.  EKGs/Labs/Other Studies Reviewed:    The following studies were reviewed today:  Echo 12/2019  1. Left ventricular ejection fraction, by estimation, is 55 to 60%. The  left ventricle has normal function. The left ventricle has no regional  wall motion  abnormalities. Left ventricular diastolic parameters were  normal.  2. Right ventricular systolic function is normal. The right ventricular  size is normal.  3. The mitral valve is normal in structure. No evidence of mitral valve  regurgitation. No evidence of mitral stenosis.  4. The aortic valve is normal in structure. Aortic valve regurgitation is  not visualized. No aortic stenosis is present.  5. The inferior vena cava is normal in size with greater than 50%  respiratory variability, suggesting right atrial pressure of 3 mmHg.   Cardiac cath 2017   Mid RCA lesion, 90 %stenosed.  A STENT PROMUS PREM MR [id number] drug eluting stent was successfully placed, postdilated to 3.8 mm.  Post intervention, there is a 0% residual stenosis.  Mild, scattered plaque in the left sided vessels.  The left ventricular systolic function is normal. LV end diastolic pressure is mildly elevated, LVEDP 20 mm Hg.  The left ventricular ejection fraction is 55-65% by visual estimate.  There is no aortic valve stenosis.  Continue dual antiplatelet therapy along with aggressive secondary prevention. Candidate for same day PCI. He will need plavix supply before he leaves the hospital.  Coronary Diagrams   Diagnostic Dominance: Right    Intervention       EKG:  EKG is *** ordered today.  The ekg ordered today demonstrates ***  Recent Labs: 05/28/2020: ALT 16; BUN 11; Creatinine, Ser 0.95; Hemoglobin 14.8; Platelets 198; Potassium 4.8; Sodium 138; TSH 0.765  Recent Lipid Panel    Component Value Date/Time   CHOL 115 05/28/2020 0814   TRIG 96 05/28/2020 0814   HDL 33 (L) 05/28/2020 0814   CHOLHDL 3.5 05/28/2020 0814   CHOLHDL 5 07/31/2008 0958   VLDL 32.6 07/31/2008 0958   LDLCALC 64 05/28/2020 0814     Risk Assessment/Calculations:   {Does this patient have ATRIAL FIBRILLATION?:[id number]}   Physical Exam:    VS:  There were no vitals taken for this visit.     Wt Readings from Last 3 Encounters:  07/25/20 163 lb (73.9 kg)  06/24/20 163 lb (73.9 kg)  06/03/20 163 lb (73.9 kg)     GEN: *** Well nourished, well developed in no acute distress HEENT: Normal NECK: No JVD; No carotid bruits LYMPHATICS: No lymphadenopathy CARDIAC: ***RRR, no murmurs, rubs, gallops RESPIRATORY:  Clear to auscultation without rales, wheezing or rhonchi  ABDOMEN: Soft, non-tender, non-distended MUSCULOSKELETAL:  No edema; No deformity  SKIN: Warm and dry NEUROLOGIC:  Alert and oriented x 3 PSYCHIATRIC:  Normal affect   ASSESSMENT:    No diagnosis found. PLAN:    In order of problems listed above:  1. ***  Disposition: Follow up {follow up:15908} with ***   Shared Decision Making/Informed Consent   {Are you ordering a CV Procedure (e.g. stress  test, cath, DCCV, TEE, etc)?   Press F2        :[id number]}    Signed, Kayleann Mccaffery Ardelle Lesches  08/19/2020 7:47 AM    [facility]

## 2020-09-04 ENCOUNTER — Other Ambulatory Visit: Payer: Self-pay

## 2020-09-04 ENCOUNTER — Telehealth: Payer: Self-pay | Admitting: Adult Health

## 2020-09-04 DIAGNOSIS — R059 Cough, unspecified: Secondary | ICD-10-CM

## 2020-09-04 DIAGNOSIS — U071 COVID-19: Secondary | ICD-10-CM

## 2020-09-04 DIAGNOSIS — J22 Unspecified acute lower respiratory infection: Secondary | ICD-10-CM

## 2020-09-04 MED ORDER — GUAIFENESIN-CODEINE 100-10 MG/5ML PO SYRP: 5.0000 mL | ORAL_SOLUTION | Freq: Every evening | ORAL | 0 refills | Status: DC | PRN

## 2020-09-04 MED ORDER — AZITHROMYCIN 250 MG PO TABS: ORAL_TABLET | ORAL | 0 refills | Status: AC

## 2020-09-04 MED ORDER — ALBUTEROL SULFATE HFA 108 (90 BASE) MCG/ACT IN AERS: 1.0000 | INHALATION_SPRAY | Freq: Four times a day (QID) | RESPIRATORY_TRACT | 0 refills | Status: DC | PRN

## 2020-09-04 NOTE — Progress Notes (Signed)
[facility] [address], [place] [address]   Office Visit Note  Patient Name: Paul Koch  [id number]  [id number]  Date of Service: 09/04/2020  Chief Complaint  Patient presents with  . Cough     HPI Pt is here for a sick visit. He was seen in his Car due to testing positive for Covid 8-9 days ago.  Since testing positive his symptoms have progressed.  He continues to have sinus pressure and drainage.  His PND is so bad, he can not lay flat to sleep.  He wakes up choking, and now is noticing SOB, and productive cough at times. He denies fever/chills.     Current Medication:  Outpatient Encounter Medications as of 09/04/2020  Medication Sig  . albuterol (VENTOLIN HFA) 108 (90 Base) MCG/ACT inhaler Inhale 1-2 puffs into the lungs every 6 (six) hours as needed for shortness of breath.  Marland Kitchen amLODipine (NORVASC) 5 MG tablet Take 1 tablet (5 mg total) by mouth 2 (two) times daily.  . ASPIRIN LOW DOSE 81 MG EC tablet TAKE 1 TABLET BY MOUTH EVERY DAY  . atorvastatin (LIPITOR) 20 MG tablet Take 1 tablet by mouth daily  . azithromycin (ZITHROMAX) 250 MG tablet Take 2 tablets on day 1, then 1 tablet daily on days 2 through 5  . clonazePAM (KLONOPIN) 1 MG tablet Take 1 tablet (1 mg total) by mouth 2 (two) times daily as needed for anxiety.  Marland Kitchen FLUoxetine (PROZAC) 20 MG tablet TAKE 3 TABLETS BY MOUTH EVERY DAY  . guaiFENesin-codeine (ROBITUSSIN AC) 100-10 MG/5ML syrup Take 5 mLs by mouth at bedtime as needed for cough.  . loratadine (CLARITIN) 10 MG tablet Take 1 tablet (10 mg total) by mouth daily as needed for allergies.  . metoprolol tartrate (LOPRESSOR) 25 MG tablet TAKE 1 TABLET BY MOUTH TWICE A DAY  . ramipril (ALTACE) 10 MG capsule Take 1 capsule (10 mg total) by mouth 2 (two) times daily.  . sildenafil (VIAGRA) 25 MG tablet Take 1 tablet (25 mg total) by mouth daily as needed for erectile dysfunction.  Marland Kitchen zolpidem (AMBIEN) 10 MG tablet Take 1 tablet (10 mg total) by mouth  at bedtime as needed for sleep.   No facility-administered encounter medications on file as of 09/04/2020.      Medical History: Past Medical History:  Diagnosis Date  . AAA (abdominal aortic aneurysm) (HCC)   . Anxiety disorder   . CAD (coronary artery disease)   . COPD (chronic obstructive pulmonary disease) (HCC)   . Eczema   . ED (erectile dysfunction)   . Elevated lipids   . Hyperplastic colon polyp 2014   due 2024  . Hypertension   . Lung nodule      Vital Signs: There were no vitals taken for this visit.   Review of Systems  Constitutional: Negative for activity change, appetite change and fatigue.  HENT: Positive for postnasal drip, sinus pressure and sinus pain. Negative for congestion, trouble swallowing and voice change.   Eyes: Negative for pain, discharge and visual disturbance.  Respiratory: Positive for cough. Negative for chest tightness and shortness of breath.   Cardiovascular: Negative for chest pain and leg swelling.  Gastrointestinal: Negative for abdominal distention, abdominal pain, constipation and diarrhea.  Musculoskeletal: Negative for arthralgias, back pain and neck pain.  Skin: Negative for color change.  Neurological: Negative for dizziness, weakness and headaches.  Hematological: Negative for adenopathy.  Psychiatric/Behavioral: Negative for agitation, confusion and suicidal ideas.  Physical Exam Constitutional:      Appearance: Normal appearance.  HENT:     Head: Normocephalic.     Nose: Nose normal.     Mouth/Throat:     Mouth: Mucous membranes are moist.  Cardiovascular:     Rate and Rhythm: Normal rate.  Pulmonary:     Effort: Pulmonary effort is normal.     Breath sounds: Wheezing and rhonchi present.  Musculoskeletal:        General: No swelling or deformity. Normal range of motion.  Skin:    General: Skin is warm and dry.     Capillary Refill: Capillary refill takes less than 2 seconds.  Neurological:     General: No  focal deficit present.     Mental Status: He is alert.     Cranial Nerves: No cranial nerve deficit.     Gait: Gait normal.  Psychiatric:        Mood and Affect: Mood normal.        Behavior: Behavior normal.        Judgment: Judgment normal.    Assessment/Plan: 1. Lower respiratory infection Use inhaler and antibiotics as prescribed.  Take complete course of antibiotics as prescribed.  Take with food.  If symptoms fail to improve, or new/worse symptoms develop follow up in clinic. Use codeine cough syrup as discussed.  Reviewed risks and possible side effects associated with taking opiates, benzodiazepines and other CNS depressants. Combination of these could cause dizziness and drowsiness. Advised patient not to drive or operate machinery when taking these medications, as patient's and other's life can be at risk and will have consequences. Patient verbalized understanding in this matter. Dependence and abuse for these drugs will be monitored closely.   - albuterol (VENTOLIN HFA) 108 (90 Base) MCG/ACT inhaler; Inhale 1-2 puffs into the lungs every 6 (six) hours as needed for shortness of breath.  Dispense: 8 g; Refill: 0 - guaiFENesin-codeine (ROBITUSSIN AC) 100-10 MG/5ML syrup; Take 5 mLs by mouth at bedtime as needed for cough.  Dispense: 40 mL; Refill: 0 - azithromycin (ZITHROMAX) 250 MG tablet; Take 2 tablets on day 1, then 1 tablet daily on days 2 through 5  Dispense: 6 tablet; Refill: 0  2. Cough Continue OTC meds as before.  Use RX meds as discussed.  - albuterol (VENTOLIN HFA) 108 (90 Base) MCG/ACT inhaler; Inhale 1-2 puffs into the lungs every 6 (six) hours as needed for shortness of breath.  Dispense: 8 g; Refill: 0 - guaiFENesin-codeine (ROBITUSSIN AC) 100-10 MG/5ML syrup; Take 5 mLs by mouth at bedtime as needed for cough.  Dispense: 40 mL; Refill: 0  3. COVID Continue OTC meds, discussed going to ER if symptoms worsen such as increases SOB, chest pain etc.     General  Counseling: Jeannine Boga understanding of the findings of todays visit and agrees with plan of treatment. I have discussed any further diagnostic evaluation that may be needed or ordered today. We also reviewed his medications today. he has been encouraged to call the office with any questions or concerns that should arise related to todays visit.   No orders of the defined types were placed in this encounter.   Meds ordered this encounter  Medications  . albuterol (VENTOLIN HFA) 108 (90 Base) MCG/ACT inhaler    Sig: Inhale 1-2 puffs into the lungs every 6 (six) hours as needed for shortness of breath.    Dispense:  8 g    Refill:  0  . guaiFENesin-codeine (  ROBITUSSIN AC) 100-10 MG/5ML syrup    Sig: Take 5 mLs by mouth at bedtime as needed for cough.    Dispense:  40 mL    Refill:  0  . azithromycin (ZITHROMAX) 250 MG tablet    Sig: Take 2 tablets on day 1, then 1 tablet daily on days 2 through 5    Dispense:  6 tablet    Refill:  0    Time spent: 20 Minutes    Johnna Acosta AGNP-C Nurse Practitioner

## 2020-09-04 NOTE — Progress Notes (Signed)
Pt presents today stating he tested positive for covid Monday 08/26/20. He states he's short of breath and cant breathe at night. Nose runs and sinus pressure gets worse at night as well. CL,RMA

## 2020-09-06 ENCOUNTER — Other Ambulatory Visit: Payer: Self-pay | Admitting: Emergency Medicine

## 2020-09-06 DIAGNOSIS — J302 Other seasonal allergic rhinitis: Secondary | ICD-10-CM

## 2020-09-06 DIAGNOSIS — E785 Hyperlipidemia, unspecified: Secondary | ICD-10-CM

## 2020-10-01 ENCOUNTER — Other Ambulatory Visit: Payer: Self-pay | Admitting: Adult Health

## 2020-10-01 DIAGNOSIS — J22 Unspecified acute lower respiratory infection: Secondary | ICD-10-CM

## 2020-10-01 DIAGNOSIS — R059 Cough, unspecified: Secondary | ICD-10-CM

## 2020-11-04 ENCOUNTER — Other Ambulatory Visit: Payer: Self-pay

## 2020-11-04 DIAGNOSIS — F411 Generalized anxiety disorder: Secondary | ICD-10-CM

## 2020-11-04 MED ORDER — CLONAZEPAM 1 MG PO TABS: 1.0000 mg | ORAL_TABLET | Freq: Two times a day (BID) | ORAL | 2 refills | Status: DC | PRN

## 2020-11-21 ENCOUNTER — Other Ambulatory Visit: Payer: Self-pay | Admitting: Emergency Medicine

## 2020-11-21 DIAGNOSIS — I10 Essential (primary) hypertension: Secondary | ICD-10-CM

## 2020-12-02 ENCOUNTER — Other Ambulatory Visit: Payer: Self-pay | Admitting: Emergency Medicine

## 2020-12-02 DIAGNOSIS — I10 Essential (primary) hypertension: Secondary | ICD-10-CM

## 2020-12-06 ENCOUNTER — Telehealth: Payer: Self-pay | Admitting: Internal Medicine

## 2020-12-06 MED ORDER — AMLODIPINE BESYLATE 5 MG PO TABS: 5.0000 mg | ORAL_TABLET | Freq: Two times a day (BID) | ORAL | 0 refills | Status: DC

## 2020-12-06 NOTE — Telephone Encounter (Signed)
Please advise for 90 day refill request. Pt overdue for 1 month f/u. Pt last seen 04/2019.

## 2020-12-06 NOTE — Telephone Encounter (Signed)
Requested Prescriptions   Signed Prescriptions Disp Refills   amLODipine (NORVASC) 5 MG tablet 180 tablet 0    Sig: Take 1 tablet (5 mg total) by mouth 2 (two) times daily. Pt needs to contact office to schedule future appointment and refills, Thank you. [phone]    Authorizing Provider: END, CHRISTOPHER    Ordering User: Kendrick Fries

## 2020-12-06 NOTE — Telephone Encounter (Signed)
Pt last seen by Cadence Fransico Michael, PA 06/24/20.  Was to follow up 1 month.  Looks like pt was a no show for 1 month follow up, then had documentation of having COVID shortly after. So likely no show d/t COVID.   Ok to send 90 day per request since pt is completely out and will need follow up scheduled.  No refills until pt follow up.

## 2020-12-06 NOTE — Telephone Encounter (Signed)
*  STAT* If patient is at the pharmacy, call can be transferred to refill team.   1. Which medications need to be refilled? (please list name of each medication and dose if known) amLODipine (NORVASC) 5 MG tablet  2. Which pharmacy/location (including street and city if local pharmacy) is medication to be sent to? [id number], [place] - [address]  3. Do they need a 30 day or 90 day supply? 90 day   Patient is completely out of medication

## 2020-12-06 NOTE — Telephone Encounter (Signed)
Please contact pt for future appointment. Pt last seen 06/2020. Pt overdue for 1 month f/u. Pt needing refills.

## 2020-12-16 ENCOUNTER — Other Ambulatory Visit: Payer: Self-pay

## 2020-12-16 DIAGNOSIS — G4700 Insomnia, unspecified: Secondary | ICD-10-CM

## 2020-12-16 MED ORDER — ZOLPIDEM TARTRATE 10 MG PO TABS: 10.0000 mg | ORAL_TABLET | Freq: Every evening | ORAL | 2 refills | Status: DC | PRN

## 2020-12-20 ENCOUNTER — Other Ambulatory Visit: Payer: Self-pay | Admitting: Physician Assistant

## 2020-12-20 DIAGNOSIS — I2510 Atherosclerotic heart disease of native coronary artery without angina pectoris: Secondary | ICD-10-CM

## 2021-01-06 ENCOUNTER — Other Ambulatory Visit: Payer: Self-pay

## 2021-01-06 NOTE — Telephone Encounter (Signed)
Paul Koch called clinic requesting Rx refill for Prozac 20 mg tablets.  Went to input the Rx refill request into Epic & box opened up indicating a Rx refill request was already in the system sent electronically by patient's pharmacy to [facility] ED.  Re-routed Rx refill for Prozac 20 mg tablets to Durward Parcel, PA-C.  AMD

## 2021-02-04 ENCOUNTER — Other Ambulatory Visit: Payer: Self-pay

## 2021-02-04 DIAGNOSIS — F411 Generalized anxiety disorder: Secondary | ICD-10-CM

## 2021-02-04 MED ORDER — CLONAZEPAM 1 MG PO TABS: 1.0000 mg | ORAL_TABLET | Freq: Two times a day (BID) | ORAL | 2 refills | Status: DC | PRN

## 2021-02-28 ENCOUNTER — Other Ambulatory Visit: Payer: Self-pay | Admitting: Internal Medicine

## 2021-04-05 ENCOUNTER — Other Ambulatory Visit: Payer: Self-pay | Admitting: Internal Medicine

## 2021-04-22 ENCOUNTER — Other Ambulatory Visit: Payer: Self-pay

## 2021-04-22 DIAGNOSIS — G4700 Insomnia, unspecified: Secondary | ICD-10-CM

## 2021-04-23 MED ORDER — ZOLPIDEM TARTRATE 10 MG PO TABS: 10.0000 mg | ORAL_TABLET | Freq: Every evening | ORAL | 2 refills | Status: DC | PRN

## 2021-04-27 ENCOUNTER — Other Ambulatory Visit: Payer: Self-pay | Admitting: Physician Assistant

## 2021-04-27 DIAGNOSIS — E785 Hyperlipidemia, unspecified: Secondary | ICD-10-CM

## 2021-05-09 ENCOUNTER — Other Ambulatory Visit: Payer: Self-pay

## 2021-05-09 DIAGNOSIS — F411 Generalized anxiety disorder: Secondary | ICD-10-CM

## 2021-05-09 MED ORDER — CLONAZEPAM 1 MG PO TABS: 1.0000 mg | ORAL_TABLET | Freq: Two times a day (BID) | ORAL | 2 refills | Status: DC | PRN

## 2021-06-09 ENCOUNTER — Other Ambulatory Visit: Payer: Self-pay | Admitting: Internal Medicine

## 2021-06-09 NOTE — Telephone Encounter (Signed)
Please schedule office visit for refills. Patient did not show for last scheduled office visit.Thank you! ?

## 2021-06-09 NOTE — Telephone Encounter (Signed)
Attempted to schedule no ans no vm  

## 2021-06-13 NOTE — Telephone Encounter (Signed)
Scheduled

## 2021-07-24 ENCOUNTER — Encounter: Payer: Self-pay | Admitting: Internal Medicine

## 2021-07-24 ENCOUNTER — Other Ambulatory Visit: Payer: Self-pay

## 2021-07-24 ENCOUNTER — Telehealth: Payer: Self-pay | Admitting: Medical

## 2021-07-24 MED ORDER — AMLODIPINE BESYLATE 5 MG PO TABS: 5.0000 mg | ORAL_TABLET | Freq: Two times a day (BID) | ORAL | 0 refills | Status: DC

## 2021-07-24 NOTE — Telephone Encounter (Signed)
?*  STAT* If patient is at the pharmacy, call can be transferred to refill team. ? ? ?1. Which medications need to be refilled? (please list name of each medication and dose if known) amlodipine ? ?2. Which pharmacy/location (including street and city if local pharmacy) is medication to be sent to?[facility]  ? ?3. Do they need a 30 day or 90 day supply? 90  ? ? ?

## 2021-07-24 NOTE — Telephone Encounter (Signed)
error 

## 2021-07-24 NOTE — Telephone Encounter (Signed)
amLODipine (NORVASC) 5 MG tablet 90 tablet 0 07/24/2021 09/07/2021   ?Sig - Route: Take 1 tablet (5 mg total) by mouth 2 (two) times daily. - Oral   ?Sent to pharmacy as: amLODipine (NORVASC) 5 MG tablet   ?E-Prescribing Status: Receipt confirmed by pharmacy (07/24/2021  3:46 PM EDT)   ? ?Pharmacy ? ?[id number] [place], [place] - [address]  ? ?

## 2021-07-28 ENCOUNTER — Ambulatory Visit: Payer: 59 | Admitting: Medical

## 2021-08-04 ENCOUNTER — Other Ambulatory Visit: Payer: Self-pay | Admitting: Physician Assistant

## 2021-08-04 DIAGNOSIS — F411 Generalized anxiety disorder: Secondary | ICD-10-CM

## 2021-08-08 ENCOUNTER — Other Ambulatory Visit: Payer: Self-pay

## 2021-08-08 NOTE — Telephone Encounter (Signed)
Paul Koch called [facility] requesting Rx refill for Klonopin 1 mg. ? ?Upon entering refill info into Epic, a box opened up for a pended Rx refill for Klonopin 1 mg that was sent electronically by patient's pharmacy to [facility] ED. ? ?Re-routed the pended Rx refill request for Klonopin 1 mg to Durward Parcel, PA-C. ? ?AMD ?

## 2021-08-15 ENCOUNTER — Ambulatory Visit: Payer: 59 | Admitting: Medical

## 2021-08-18 ENCOUNTER — Ambulatory Visit (INDEPENDENT_AMBULATORY_CARE_PROVIDER_SITE_OTHER): Payer: 59 | Admitting: Medical

## 2021-08-18 ENCOUNTER — Encounter: Payer: Self-pay | Admitting: Medical

## 2021-08-18 VITALS — BP 100/60 | HR 59 | Ht 69.0 in | Wt 153.2 lb

## 2021-08-18 DIAGNOSIS — E782 Mixed hyperlipidemia: Secondary | ICD-10-CM | POA: Diagnosis not present

## 2021-08-18 DIAGNOSIS — I10 Essential (primary) hypertension: Secondary | ICD-10-CM

## 2021-08-18 DIAGNOSIS — I719 Aortic aneurysm of unspecified site, without rupture: Secondary | ICD-10-CM

## 2021-08-18 DIAGNOSIS — I2510 Atherosclerotic heart disease of native coronary artery without angina pectoris: Secondary | ICD-10-CM | POA: Diagnosis not present

## 2021-08-18 MED ORDER — AMLODIPINE BESYLATE 5 MG PO TABS: 5.0000 mg | ORAL_TABLET | Freq: Every day | ORAL | 3 refills | Status: DC

## 2021-08-18 NOTE — Progress Notes (Signed)
?Cardiology Office Note:   ? ?Date:  08/18/2021  ? ?ID:  Paul Koch, DOB Dec 27, 1960, MRN [id number] ? ?PCP:  Joni Reining, PA-C  ?[facility] Cardiologist:  Yvonne Kendall, MD  ?[facility] Electrophysiologist:  None  ? ?Referring MD: Joni Reining, PA-C  ? ?Chief Complaint: Overdue follow-up ? ?History of Present Illness:   ? ?Paul Koch is a 61 y.o. male with a hx of CAD s/p PCI to [facility] 01/2016, HTN, AAA, HLD, bradycardia on BB, anixiety, solitary lung nodule (13mm) monitored by CT, thryoid nodule with recommendation forbx deferred, and remote smoking history who presents for CAD follow-up. ?  ?He underwent MPI 01/2016 tht showed moderate size basal inferior septal, mid, inf, septal, mid inf and apical defect consistend with iscemia or scar. Treadmill test showed ST depression in the inferior leads. LVEF 65%. He underwent LHC and had PCI 01/2016. He was treated with DES to [facility]. LVEF 55-65%. He underwent abdominal US 03/2016 showed atherosclerosis plaque of the abdominal aorta at the upper limits, 2.9cm with most recent 2019 showed 3.1cm.  ? ?Last seen 06/26/20 and was overall doing well. BP was intermittently high, lifestyle changes were recommended. HE reported claudication and ABIs were ordered, which were unremarkable. ? ?He saw [facility] for AAA 08/2020, most recent imaging study showed 3.7cm AAA ? ?Today, the patient reports he has been doing well overall. He noticed that the COPD is worsening, especially with exertion. He uses his inhaler once every 2-3 weeks. He denies chest pain. Also feels more tired at times. He has rare dizziness and lightheadedness, worse after a long day. He is fairly active at his job. Has issues with ED, he had a lot of thoughts regarding this. He doesn't smoke. He gets annual labs at his job and follows regularly with PCP. He denies orthopnea, pnd. Has trace LLE.  ? ?Past Medical History:  ?Diagnosis Date  ? AAA (abdominal aortic aneurysm) (HCC)   ? Anxiety disorder   ? CAD  (coronary artery disease)   ? COPD (chronic obstructive pulmonary disease) (HCC)   ? Eczema   ? ED (erectile dysfunction)   ? Elevated lipids   ? Hyperplastic colon polyp 2014  ? due 2024  ? Hypertension   ? Lung nodule   ? ? ?Past Surgical History:  ?Procedure Laterality Date  ? CARDIAC CATHETERIZATION N/A 01/30/2016  ? Procedure: Left Heart Cath and Coronary Angiography;  Surgeon: Corky Crafts, MD;  Location: [facility] INVASIVE CV LAB;  Service: Cardiovascular;  Laterality: N/A;  ? CARDIAC CATHETERIZATION N/A 01/30/2016  ? Procedure: Coronary Stent Intervention;  Surgeon: Corky Crafts, MD;  Location: [facility] INVASIVE CV LAB;  Service: Cardiovascular;  Laterality: N/A;  ? TONSILLECTOMY AND ADENOIDECTOMY    ? VASECTOMY    ? ? ?Current Medications: ?Current Meds  ?Medication Sig  ? albuterol (VENTOLIN HFA) 108 (90 Base) MCG/ACT inhaler INHALE 1-2 PUFFS INTO THE LUNGS EVERY 6 (SIX) HOURS AS NEEDED FOR SHORTNESS OF BREATH. (Patient taking differently: Inhale 1-2 puffs into the lungs as needed for shortness of breath.)  ? ASPIRIN LOW DOSE 81 MG EC tablet TAKE 1 TABLET BY MOUTH EVERY DAY  ? atorvastatin (LIPITOR) 20 MG tablet TAKE 1 TABLET BY MOUTH EVERY DAY  ? clonazePAM (KLONOPIN) 1 MG tablet Take 1 tablet (1 mg total) by mouth 2 (two) times daily as needed for anxiety.  ? FLUoxetine (PROZAC) 20 MG tablet TAKE 3 TABLETS BY MOUTH EVERY DAY  ? loratadine (CLARITIN) 10 MG  tablet TAKE 1 TABLET BY MOUTH EVERY DAY AS NEEDED FOR ALLERGY  ? metoprolol tartrate (LOPRESSOR) 25 MG tablet TAKE 1 TABLET BY MOUTH TWICE A DAY  ? ramipril (ALTACE) 10 MG capsule TAKE 1 CAPSULE (10 MG TOTAL) BY MOUTH 2 (TWO) TIMES DAILY.  ? sildenafil (VIAGRA) 25 MG tablet Take 1 tablet (25 mg total) by mouth daily as needed for erectile dysfunction.  ? zolpidem (AMBIEN) 10 MG tablet Take 1 tablet (10 mg total) by mouth at bedtime as needed for sleep.  ? [DISCONTINUED] amLODipine (NORVASC) 5 MG tablet Take 1 tablet (5 mg total) by mouth 2 (two) times  daily.  ?  ? ?Allergies:   Clindamycin hcl  ? ?Social History  ? ?Socioeconomic History  ? Marital status: Married  ?  Spouse name: Not on file  ? Number of children: Not on file  ? Years of education: Not on file  ? Highest education level: Not on file  ?Occupational History  ? Not on file  ?Tobacco Use  ? Smoking status: Former  ?  Packs/day: 1.00  ?  Years: 20.00  ?  Pack years: 20.00  ?  Types: Cigarettes  ?  Quit date: 2004  ?  Years since quitting: 19.3  ? Smokeless tobacco: Never  ?Vaping Use  ? Vaping Use: Never used  ?Substance and Sexual Activity  ? Alcohol use: No  ?  Comment: social (5 drinks/year)  ? Drug use: No  ? Sexual activity: Not on file  ?Other Topics Concern  ? Not on file  ?Social History Narrative  ? Not on file  ? ?Social Determinants of Health  ? ?Financial Resource Strain: Not on file  ?Food Insecurity: Not on file  ?Transportation Needs: Not on file  ?Physical Activity: Not on file  ?Stress: Not on file  ?Social Connections: Not on file  ?  ? ?Family History: ?The patient's family history includes Arrhythmia in his mother; Bladder Cancer in his mother; Brain cancer in his mother; Emphysema in his father; Hyperlipidemia in his mother; Hypertension in his mother; Lung cancer in his mother; Stroke in his mother. ? ?ROS:   ?Please see the history of present illness.    ? All other systems reviewed and are negative. ? ?EKGs/Labs/Other Studies Reviewed:   ? ?The following studies were reviewed today: ? ?Echo 2021 ? 1. Left ventricular ejection fraction, by estimation, is 55 to 60%. The  ?left ventricle has normal function. The left ventricle has no regional  ?wall motion abnormalities. Left ventricular diastolic parameters were  ?normal.  ? 2. Right ventricular systolic function is normal. The right ventricular  ?size is normal.  ? 3. The mitral valve is normal in structure. No evidence of mitral valve  ?regurgitation. No evidence of mitral stenosis.  ? 4. The aortic valve is normal in  structure. Aortic valve regurgitation is  ?not visualized. No aortic stenosis is present.  ? 5. The inferior vena cava is normal in size with greater than 50%  ?respiratory variability, suggesting right atrial pressure of 3 mmHg.  ? ? ?EKG:  EKG is  ordered today.  The ekg ordered today demonstrates SB 59bpm, LAD, nonspecific ST/T wave changes ? ?Recent Labs: ?No results found for requested labs within last 8760 hours.  ?Recent Lipid Panel ?   ?Component Value Date/Time  ? CHOL 115 05/28/2020 0814  ? TRIG 96 05/28/2020 0814  ? HDL 33 (L) 05/28/2020 0814  ? CHOLHDL 3.5 05/28/2020 0814  ? CHOLHDL 5 07/31/2008  [id number]  ? VLDL 32.6 07/31/2008 0958  ? LDLCALC 64 05/28/2020 0814  ? ? ? ? ?Physical Exam:   ? ?VS:  BP 100/60 (BP Location: Left Arm, Patient Position: Sitting, Cuff Size: Normal)   Pulse (!) 59   Ht 5\' 9"  (1.753 m)   Wt 153 lb 4 oz (69.5 kg)   SpO2 97%   BMI 22.63 kg/m?    ? ?Wt Readings from Last 3 Encounters:  ?08/18/21 153 lb 4 oz (69.5 kg)  ?07/25/20 163 lb (73.9 kg)  ?06/24/20 163 lb (73.9 kg)  ?  ? ?GEN:  Well nourished, well developed in no acute distress ?HEENT: Normal ?NECK: No JVD; No carotid bruits ?LYMPHATICS: No lymphadenopathy ?CARDIAC: RRR, no murmurs, rubs, gallops ?RESPIRATORY:  Clear to auscultation without rales, wheezing or rhonchi  ?ABDOMEN: Soft, non-tender, non-distended ?MUSCULOSKELETAL:  No edema; No deformity  ?SKIN: Warm and dry ?NEUROLOGIC:  Alert and oriented x 3 ?PSYCHIATRIC:  Normal affect  ? ?ASSESSMENT:   ? ?1. Coronary artery disease involving native coronary artery of native heart without angina pectoris   ?2. Hyperlipidemia, mixed   ?3. Aortic aneurysm without rupture, unspecified portion of aorta (HCC)   ?4. Essential hypertension   ? ?PLAN:   ? ?In order of problems listed above: ? ?CAD s/p DES mRCA in 2017 ?Patient denies chest pain. He has exertional dyspnea which is mainly from COPD. EKG with no ischemic change today. No further ischemic work-up at this time. Continue  Aspirin, statin ,and BB therapy.  ? ?HLD ?LDL 64 05/2020. Continue atorvastatin. He will get labs at work.  ?  ?AAA ?He is following with [facility]. Most recent imaging study showed 3.7cm AAA.  ? ?HTN ?BP borderline toda

## 2021-08-18 NOTE — Patient Instructions (Signed)
Medication Instructions:  ?Your physician has recommended you make the following change in your medication:  ? ?REDUCE Amlodipine to 5 mg daily. ? ?Continue your other medications as prescribed  ? ?*If you need a refill on your cardiac medications before your next appointment, please call your pharmacy* ? ? ?Lab Work: ?None ordered ?If you have labs (blood work) drawn today and your tests are completely normal, you will receive your results only by: ?MyChart Message (if you have MyChart) OR ?A paper copy in the mail ?If you have any lab test that is abnormal or we need to change your treatment, we will call you to review the results. ? ? ?Testing/Procedures: ?None ordered ? ? ?Follow-Up: ?At [facility], you and your health needs are our priority.  As part of our continuing mission to provide you with exceptional heart care, we have created designated Provider Care Teams.  These Care Teams include your primary Cardiologist (physician) and Advanced Practice Providers (APPs -  Physician Assistants and Nurse Practitioners) who all work together to provide you with the care you need, when you need it. ? ?We recommend signing up for the patient portal called "MyChart".  Sign up information is provided on this After Visit Summary.  MyChart is used to connect with patients for Virtual Visits (Telemedicine).  Patients are able to view lab/test results, encounter notes, upcoming appointments, etc.  Non-urgent messages can be sent to your provider as well.   ?To learn more about what you can do with MyChart, go to [email or web address].   ? ?Your next appointment:   ?Your physician wants you to follow-up in: 1 year You will receive a reminder letter in the mail two months in advance. If you don't receive a letter, please call our office to schedule the follow-up appointment. ? ? ?The format for your next appointment:   ?In Person ? ?Provider:   ?You may see Nelva Bush, MD or one of the following Advanced Practice  Providers on your designated Care Team:   ?Murray Hodgkins, NP ?Christell Faith, PA-C ?Cadence Kathlen Mody, PA-C{ ? ? ? ?Other Instructions ?N/A ? ?Important Information About Sugar ? ? ? ? ? ? ?

## 2021-09-04 ENCOUNTER — Other Ambulatory Visit: Payer: Self-pay | Admitting: Cardiovascular Disease

## 2021-09-05 ENCOUNTER — Ambulatory Visit: Payer: Self-pay

## 2021-09-05 DIAGNOSIS — Z0000 Encounter for general adult medical examination without abnormal findings: Secondary | ICD-10-CM

## 2021-09-05 LAB — POCT URINALYSIS DIPSTICK
Bilirubin, UA: NEGATIVE
Blood, UA: NEGATIVE
Glucose, UA: NEGATIVE
Ketones, UA: NEGATIVE
Leukocytes, UA: NEGATIVE
Nitrite, UA: NEGATIVE
Protein, UA: NEGATIVE
Spec Grav, UA: 1.02 (ref 1.010–1.025)
Urobilinogen, UA: 0.2 E.U./dL
pH, UA: 6 (ref 5.0–8.0)

## 2021-09-06 LAB — CMP12+LP+TP+TSH+6AC+PSA+CBC…
ALT: 15 IU/L (ref 0–44)
AST: 18 IU/L (ref 0–40)
Albumin/Globulin Ratio: 1.5 (ref 1.2–2.2)
Albumin: 4.1 g/dL (ref 3.8–4.8)
Alkaline Phosphatase: 87 IU/L (ref 44–121)
BUN/Creatinine Ratio: 15 (ref 10–24)
BUN: 15 mg/dL (ref 8–27)
Basophils Absolute: 0.1 10*3/uL (ref 0.0–0.2)
Basos: 1 %
Bilirubin Total: 0.6 mg/dL (ref 0.0–1.2)
Calcium: 9.1 mg/dL (ref 8.6–10.2)
Chloride: 97 mmol/L (ref 96–106)
Chol/HDL Ratio: 3.4 ratio (ref 0.0–5.0)
Cholesterol, Total: 111 mg/dL (ref 100–199)
Creatinine, Ser: 1 mg/dL (ref 0.76–1.27)
EOS (ABSOLUTE): 0.1 10*3/uL (ref 0.0–0.4)
Eos: 1 %
Estimated CHD Risk: 0.5 times avg. (ref 0.0–1.0)
Free Thyroxine Index: 2.9 (ref 1.2–4.9)
GGT: 13 IU/L (ref 0–65)
Globulin, Total: 2.7 g/dL (ref 1.5–4.5)
Glucose: 102 mg/dL — ABNORMAL HIGH (ref 70–99)
HDL: 33 mg/dL — ABNORMAL LOW (ref >39)
Hematocrit: 44 % (ref 37.5–51.0)
Hemoglobin: 15 g/dL (ref 13.0–17.7)
Immature Grans (Abs): 0 10*3/uL (ref 0.0–0.1)
Immature Granulocytes: 0 %
Iron: 55 ug/dL (ref 38–169)
LDH: 153 IU/L (ref 121–224)
LDL Chol Calc (NIH): 57 mg/dL (ref 0–99)
Lymphocytes Absolute: 1.9 10*3/uL (ref 0.7–3.1)
Lymphs: 24 %
MCH: 29.2 pg (ref 26.6–33.0)
MCHC: 34.1 g/dL (ref 31.5–35.7)
MCV: 86 fL (ref 79–97)
Monocytes Absolute: 0.5 10*3/uL (ref 0.1–0.9)
Monocytes: 7 %
Neutrophils Absolute: 5.2 10*3/uL (ref 1.4–7.0)
Neutrophils: 67 %
Phosphorus: 3.3 mg/dL (ref 2.8–4.1)
Platelets: 230 10*3/uL (ref 150–450)
Potassium: 5 mmol/L (ref 3.5–5.2)
Prostate Specific Ag, Serum: 2.5 ng/mL (ref 0.0–4.0)
RBC: 5.13 x10E6/uL (ref 4.14–5.80)
RDW: 13 % (ref 11.6–15.4)
Sodium: 138 mmol/L (ref 134–144)
T3 Uptake Ratio: 30 % (ref 24–39)
T4, Total: 9.7 ug/dL (ref 4.5–12.0)
TSH: 0.585 u[IU]/mL (ref 0.450–4.500)
Total Protein: 6.8 g/dL (ref 6.0–8.5)
Triglycerides: 116 mg/dL (ref 0–149)
Uric Acid: 6 mg/dL (ref 3.8–8.4)
VLDL Cholesterol Cal: 21 mg/dL (ref 5–40)
WBC: 7.9 10*3/uL (ref 3.4–10.8)
eGFR: 85 mL/min/{1.73_m2} (ref >59)

## 2021-09-10 ENCOUNTER — Ambulatory Visit: Payer: Self-pay | Admitting: Physician Assistant

## 2021-09-10 ENCOUNTER — Encounter: Payer: Self-pay | Admitting: Physician Assistant

## 2021-09-10 VITALS — BP 130/82 | HR 63 | Temp 97.6°F | Resp 12 | Ht 69.0 in | Wt 150.0 lb

## 2021-09-10 DIAGNOSIS — Z0000 Encounter for general adult medical examination without abnormal findings: Secondary | ICD-10-CM

## 2021-09-10 NOTE — Progress Notes (Signed)
Pt presents today to complete physical, Pt denies any issues or concerns at this time/CL,RMA 

## 2021-09-10 NOTE — Progress Notes (Signed)
[facility] [place] [facility] ____________________________________________   None    (approximate)  I have reviewed the triage vital signs and the nursing notes.   HISTORY  Chief Complaint Annual Exam   HPI Paul Koch is a 61 y.o. male patient presents for annual physical exam.  Patient voices no concerns or complaints.         Past Medical History:  Diagnosis Date   AAA (abdominal aortic aneurysm) (HCC)    Anxiety disorder    CAD (coronary artery disease)    COPD (chronic obstructive pulmonary disease) (HCC)    Eczema    ED (erectile dysfunction)    Elevated lipids    Hyperplastic colon polyp 2014   due 2024   Hypertension    Lung nodule     Patient Active Problem List   Diagnosis Date Noted   Non-functional thyroid nodule 11/17/2018   Sinus bradycardia 02/09/2018   Pain in both lower extremities 02/10/2017   Coronary artery disease 05/14/2016   Hyperlipidemia LDL goal <70 05/14/2016   Arteriosclerosis of abdominal aorta ([place]) 05/14/2016   Solitary lung nodule 05/14/2016   Abnormal stress test    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 08/06/2008   ECZEMA 08/06/2008   DERMATOPHYTOSIS OF NAIL 07/14/2007   INGROWING NAIL 07/14/2007   ANXIETY DISORDER 06/02/2007   Essential hypertension 06/02/2007    Past Surgical History:  Procedure Laterality Date   CARDIAC CATHETERIZATION N/A 01/30/2016   Procedure: Left Heart Cath and Coronary Angiography;  Surgeon: Jettie Booze, MD;  Location: [place] CV LAB;  Service: Cardiovascular;  Laterality: N/A;   CARDIAC CATHETERIZATION N/A 01/30/2016   Procedure: Coronary Stent Intervention;  Surgeon: Jettie Booze, MD;  Location: [place] CV LAB;  Service: Cardiovascular;  Laterality: N/A;   TONSILLECTOMY AND ADENOIDECTOMY     VASECTOMY      Prior to Admission medications   Medication Sig Start Date End Date Taking? Authorizing Provider  albuterol (VENTOLIN HFA) 108 (90  Base) MCG/ACT inhaler INHALE 1-2 PUFFS INTO THE LUNGS EVERY 6 (SIX) HOURS AS NEEDED FOR SHORTNESS OF BREATH. Patient taking differently: Inhale 1-2 puffs into the lungs as needed for shortness of breath. 10/02/20  Yes Scarboro, Audie Clear, NP  amLODipine (NORVASC) 5 MG tablet TAKE 1 TABLET BY MOUTH TWICE A DAY 09/04/21  Yes Furth, Cadence H, PA-C  ASPIRIN LOW DOSE 81 MG EC tablet TAKE 1 TABLET BY MOUTH EVERY DAY 03/20/20  Yes Sable Feil, PA-C  atorvastatin (LIPITOR) 20 MG tablet TAKE 1 TABLET BY MOUTH EVERY DAY 09/06/20  Yes Sable Feil, PA-C  clonazePAM (KLONOPIN) 1 MG tablet Take 1 tablet (1 mg total) by mouth 2 (two) times daily as needed for anxiety. 08/08/21  Yes Sable Feil, PA-C  FLUoxetine (PROZAC) 20 MG tablet TAKE 3 TABLETS BY MOUTH EVERY DAY 01/06/21  Yes Sable Feil, PA-C  loratadine (CLARITIN) 10 MG tablet TAKE 1 TABLET BY MOUTH EVERY DAY AS NEEDED FOR ALLERGY 09/06/20  Yes Sable Feil, PA-C  metoprolol tartrate (LOPRESSOR) 25 MG tablet TAKE 1 TABLET BY MOUTH TWICE A DAY 12/03/20  Yes Sable Feil, PA-C  ramipril (ALTACE) 10 MG capsule TAKE 1 CAPSULE (10 MG TOTAL) BY MOUTH 2 (TWO) TIMES DAILY. 11/21/20  Yes Sable Feil, PA-C  sildenafil (VIAGRA) 25 MG tablet Take 1 tablet (25 mg total) by mouth daily as needed for erectile dysfunction. 08/24/19  Yes Earleen Newport, MD  zolpidem [facility]) 10  MG tablet Take 1 tablet (10 mg total) by mouth at bedtime as needed for sleep. 04/23/21  Yes Sable Feil, PA-C    Allergies Clindamycin hcl  Family History  Problem Relation Age of Onset   Brain cancer Mother    Lung cancer Mother    Arrhythmia Mother    Hypertension Mother    Hyperlipidemia Mother    Bladder Cancer Mother    Stroke Mother    Emphysema Father     Social History Social History   Tobacco Use   Smoking status: Former    Packs/day: 1.00    Years: 20.00    Pack years: 20.00    Types: Cigarettes    Quit date: 2004    Years since quitting: 19.4    Smokeless tobacco: Never  Vaping Use   Vaping Use: Never used  Substance Use Topics   Alcohol use: No    Comment: social (5 drinks/year)   Drug use: No    Review of Systems Constitutional: No fever/chills Eyes: No visual changes. ENT: No sore throat. Cardiovascular: Denies chest pain. Respiratory: Denies shortness of breath. Gastrointestinal: No abdominal pain.  No nausea, no vomiting.  No diarrhea.  No constipation. Genitourinary: Negative for dysuria. Musculoskeletal: Negative for back pain. Skin: Negative for rash. Neurological: Negative for headaches, focal weakness or numbness. Psychiatric: Anxiety Endocrine: Hyperlipidemia and hypertension Allergic/Immunilogical: Clindamycin ____________________________________________   PHYSICAL EXAM:  VITAL SIGNS: BP is 130/82, pulse 63, respiration 12, temperature 97.6, patient 96% O2 sat on room air.  Patient weighs 150 pounds and BMI is 22.15. Constitutional: Alert and oriented. Well appearing and in no acute distress. Eyes: Conjunctivae are normal. PERRL. EOMI. Head: Atraumatic. Nose: No congestion/rhinnorhea. Mouth/Throat: Mucous membranes are moist.  Oropharynx non-erythematous. Neck: No stridor.  No cervical spine tenderness to palpation. Hematological/Lymphatic/Immunilogical: No cervical lymphadenopathy. Cardiovascular: Normal rate, regular rhythm. Grossly normal heart sounds.  Good peripheral circulation. Respiratory: Normal respiratory effort.  No retractions. Lungs CTAB. Gastrointestinal: Soft and nontender. No distention. No abdominal bruits. No CVA tenderness. Genitourinary: Deferred Musculoskeletal: No lower extremity tenderness nor edema.  No joint effusions. Neurologic:  Normal speech and language. No gross focal neurologic deficits are appreciated. No gait instability. Skin:  Skin is warm, dry and intact. No rash noted. Psychiatric: Mood and affect are normal. Speech and behavior are  normal.  ____________________________________________   LABS      Component Ref Range & Units 5 d ago 2 yr ago  Color, UA  yellow  Dark Yellow   Clarity, UA  clear  Clear   Glucose, UA Negative Negative  Negative   Bilirubin, UA  negative  Negative   Ketones, UA  negative  Negative   Spec Grav, UA 1.010 - 1.025 1.020  1.025   Blood, UA  negative  Negative   pH, UA 5.0 - 8.0 6.0  6.0   Protein, UA Negative Negative  Positive Abnormal  CM   Urobilinogen, UA 0.2 or 1.0 E.U./dL 0.2  0.2   Nitrite, UA  negative  Negative   Leukocytes, UA Negative Negative  Negative   Appearance      Odor                      Other Results from 09/05/2021   Contains abnormal data CMP12+LP+TP+TSH+6AC+PSA+CBC. Order: [id number] Status: Final result    Visible to patient: Yes (not seen)    Next appt: None    Dx: Routine adult health maintenance    0  Result Notes           Component Ref Range & Units 5 d ago (09/05/21) 1 yr ago (05/28/20) 2 yr ago (05/05/19) 5 yr ago (02/05/16) 5 yr ago (02/05/16) 5 yr ago (01/28/16) 5 yr ago (01/28/16)  Glucose 70 - 99 mg/dL 102 High   96 R  88 R  78 R   104 High  R    Uric Acid 3.8 - 8.4 mg/dL 6.0  4.7 CM  5.0 CM       Comment:            Therapeutic target for gout patients: <6.0  BUN 8 - 27 mg/dL _0 R   18 R    Creatinine, Ser 0.76 - 1.27 mg/dL 1.00  0.95 CM  0.93  0.94   1.13 R    eGFR >59 mL/min/1.73 85         BUN/Creatinine Ratio 10 - _1 R      Sodium 134 - 144 mmol/L 138  138  139  135   137 R    Potassium 3.5 - 5.2 mmol/L 5.0  4.8  5.0  4.9   4.4 R    Chloride 96 - 106 mmol/L 97  100  100  95 Low    100 Low  R    Calcium 8.6 - 10.2 mg/dL 9.1  8.6  8.8  8.9 R   9.2 R    Phosphorus 2.8 - 4.1 mg/dL 3.3  3.3  3.9       Total Protein 6.0 - 8.5 g/dL 6.8  6.8  6.6  6.7      Albumin 3.8 - 4.8 g/dL 4.1  4.2  4.3 R  4.1 R      Globulin, Total 1.5 - 4.5 g/dL 2.7  2.6  2.3  2.6      Albumin/Globulin Ratio 1.2 - 2.2 1.5  1.6  1.9   1.6      Bilirubin Total 0.0 - 1.2 mg/dL 0.6  0.4  0.4  0.3      Alkaline Phosphatase 44 - 121 IU/L 87  76  75 R  61 R      LDH 121 - 224 IU/L 153  156  164       AST 0 - 40 IU/L _2 ALT 0 - 44 IU/L _3 GGT 0 - 65 IU/L _4 Iron 38 - 169 ug/dL 55  74  76       Cholesterol, Total 100 - 199 mg/dL 111  115  111   162     Triglycerides 0 - 149 mg/dL 116  96  89   287 High      HDL >39 mg/dL 33 Low   33 Low   34 Low    29 Low      VLDL Cholesterol Cal 5 - 40 mg/dL _5 57 High      LDL Chol Calc (NIH) 0 - 99 mg/dL 57  64  60       Chol/HDL Ratio 0.0 - 5.0 ratio 3.4  3.5 CM  3.3 CM   5.6 High  R, CM     Comment:  T. Chol/HDL Ratio                                              Men  Women                                1/2 Avg.Risk  3.4    3.3                                    Avg.Risk  5.0    4.4                                 2X Avg.Risk  9.6    7.1                                 3X Avg.Risk 23.4   11.0   Estimated CHD Risk 0.0 - 1.0 times avg. 0.5  0.6 CM   < 0.5 CM       Comment: The CHD Risk is based on the T. Chol/HDL ratio. Other  factors affect CHD Risk such as hypertension, smoking,  diabetes, severe obesity, and family history of  premature CHD.   TSH 0.450 - 4.500 uIU/mL 0.585  0.765  0.795       T4, Total 4.5 - 12.0 ug/dL 9.7  9.0  7.9       T3 Uptake Ratio 24 - 39 % _0 Free Thyroxine Index 1.2 - 4.9 2.9  2.6  2.2       Prostate Specific Ag, Serum 0.0 - 4.0 ng/mL 2.5  1.2 CM  1.8 CM       Comment: Roche ECLIA methodology.  According to the American Urological Association, Serum PSA should  decrease and remain at undetectable levels after radical  prostatectomy. The AUA defines biochemical recurrence as an initial  PSA value 0.2 ng/mL or greater followed by a subsequent confirmatory  PSA value 0.2 ng/mL or greater.  Values obtained with different assay methods or kits cannot be  used  interchangeably. Results cannot be interpreted as absolute evidence  of the presence or absence of malignant disease.   WBC 3.4 - 10.8 x10E3/uL 7.9  7.0  7.6     8.8 R   RBC 4.14 - 5.80 x10E6/uL 5.13  4.93  5.15     5.35 R   Hemoglobin 13.0 - 17.7 g/dL 15.0  14.8  14.7     15.8 R   Hematocrit 37.5 - 51.0 % 44.0  43.4  44.8     45.8 R   MCV 79 - 97 fL 86  88  87     85.6 R   MCH 26.6 - 33.0 pg 29.2  30.0  28.5     29.5 R   MCHC 31.5 - 35.7 g/dL 34.1  34.1  32.8     34.4 R   RDW 11.6 - 15.4 % 13.0  13.9  13.5     13.6 R   Platelets 150 - 450 x10E3/uL 230  198  238  204 R   Neutrophils Not Estab. % 67  61  61       Lymphs Not Estab. % _0 Monocytes Not Estab. % _1 Eos Not Estab. % _2 Basos Not Estab. % 1  0  1       Neutrophils Absolute 1.4 - 7.0 x10E3/uL 5.2  4.2  4.6       Lymphocytes Absolute 0.7 - 3.1 x10E3/uL 1.9  2.2  2.4       Monocytes Absolute 0.1 - 0.9 x10E3/uL 0.5  0.4  0.4       EOS (ABSOLUTE) 0.0 - 0.4 x10E3/uL 0.1  0.1  0.1       Basophils Absolute 0.0 - 0.2 x10E3/uL 0.1  0.0  0.0       Immature Granulocytes Not Estab. % 0  0  0       Immature Grans              ___________________________________________  EKG  Sinus  Bradycardia  -RSR(V1) -nondiagnostic.   PROBABLY NORMAL ____________________________________________    ____________________________________________   INITIAL IMPRESSION / ASSESSMENT AND PLAN  As part of my medical decision making, I reviewed the following data within the [place]      Discussed labs and EKG findings with patient.        ____________________________________________   FINAL CLINICAL IMPRESSION  Well exam   ED Discharge Orders     None        Note:  This document was prepared using Dragon voice recognition software and may include unintentional dictation errors.

## 2021-09-11 ENCOUNTER — Other Ambulatory Visit: Payer: Self-pay | Admitting: Physician Assistant

## 2021-09-11 ENCOUNTER — Encounter: Payer: 59 | Admitting: Physician Assistant

## 2021-09-11 MED ORDER — ATORVASTATIN CALCIUM 20 MG PO TABS: 20.0000 mg | ORAL_TABLET | Freq: Every day | ORAL | 3 refills | Status: DC

## 2021-09-16 ENCOUNTER — Other Ambulatory Visit: Payer: Self-pay

## 2021-09-16 DIAGNOSIS — G4700 Insomnia, unspecified: Secondary | ICD-10-CM

## 2021-09-17 MED ORDER — ZOLPIDEM TARTRATE 10 MG PO TABS: 10.0000 mg | ORAL_TABLET | Freq: Every evening | ORAL | 2 refills | Status: DC | PRN

## 2021-09-22 ENCOUNTER — Other Ambulatory Visit: Payer: Self-pay

## 2021-09-22 ENCOUNTER — Other Ambulatory Visit: Payer: Self-pay | Admitting: Physician Assistant

## 2021-09-22 DIAGNOSIS — G4700 Insomnia, unspecified: Secondary | ICD-10-CM

## 2021-09-22 MED ORDER — ZOLPIDEM TARTRATE 10 MG PO TABS
10.0000 mg | ORAL_TABLET | Freq: Every evening | ORAL | 0 refills | Status: DC | PRN
Start: 2021-09-22 — End: 2021-11-04

## 2021-09-22 MED ORDER — ZOLPIDEM TARTRATE 10 MG PO TABS: 10.0000 mg | ORAL_TABLET | Freq: Every evening | ORAL | 2 refills | Status: DC | PRN

## 2021-10-23 ENCOUNTER — Encounter: Payer: Self-pay | Admitting: Physician Assistant

## 2021-10-23 ENCOUNTER — Ambulatory Visit: Payer: Self-pay | Admitting: Physician Assistant

## 2021-10-23 VITALS — BP 170/90 | HR 68 | Temp 98.4°F | Resp 16 | Wt 146.0 lb

## 2021-10-23 DIAGNOSIS — J440 Chronic obstructive pulmonary disease with acute lower respiratory infection: Secondary | ICD-10-CM

## 2021-10-23 MED ORDER — METHYLPREDNISOLONE 4 MG PO TBPK: ORAL_TABLET | ORAL | 0 refills | Status: DC

## 2021-10-23 MED ORDER — BENZONATATE 100 MG PO CAPS: 100.0000 mg | ORAL_CAPSULE | Freq: Three times a day (TID) | ORAL | 0 refills | Status: DC | PRN

## 2021-10-23 MED ORDER — AZITHROMYCIN 250 MG PO TABS: ORAL_TABLET | ORAL | 0 refills | Status: AC

## 2021-10-23 NOTE — Progress Notes (Signed)
   Subjective: Cough and wheezing    Patient ID: Paul Koch, male    DOB: [birth year], 61 y.o.   MRN: [id number]  HPI Patient complain of 1 week of cough and wheezing.  Patient states mild dyspnea which is relieved by inhaler.  Patient has a history of COPD.  Patient stated recent travel denies known contact with COVID-19.   Review of Systems COPD, coronary artery disease, and hypertension.    Objective:   Physical Exam  BP is 170/90, pulse 68, respirations 16, temperature 98.4, and patient is 91% O2 sat on room air. HEENT is unremarkable.  Neck is supple without lymphadenopathy or bruits. Lungs bilateral Rales and cough with deep inspirations. Heart is regular rate and rhythm.      Assessment & Plan: Bronchitis with COPD.  Patient given a prescription for Medrol Dosepak, Z-Pak, and Tessalon Perles.  Patient advised to use inhaler every 6 hours.  Patient advised to follow-up with emergency room if no improvement or worsening of complaint for the next 2 days.

## 2021-10-23 NOTE — Progress Notes (Signed)
C/O increased dyspnea in last 24 hrs with reported COPD and was returned from a trip out of town yesterday worse with exertion.  Stated non productive cough and tickle in throat.  Does not have oxygen nor have a way to check sat at home.  Also c/o mild chest tightness, mild audible wheezing noted with deep breath.  Resp non labored at this time.  Sat fluctuation 89-92 while in exam room.

## 2021-11-04 ENCOUNTER — Other Ambulatory Visit: Payer: Self-pay

## 2021-11-04 DIAGNOSIS — F411 Generalized anxiety disorder: Secondary | ICD-10-CM

## 2021-11-04 DIAGNOSIS — G4726 Circadian rhythm sleep disorder, shift work type: Secondary | ICD-10-CM

## 2021-11-04 MED ORDER — ZOLPIDEM TARTRATE 10 MG PO TABS: 10.0000 mg | ORAL_TABLET | Freq: Every evening | ORAL | 5 refills | Status: DC | PRN

## 2021-11-04 MED ORDER — CLONAZEPAM 1 MG PO TABS
1.0000 mg | ORAL_TABLET | Freq: Two times a day (BID) | ORAL | 3 refills | Status: DC | PRN
Start: 2021-11-04 — End: 2022-03-13

## 2021-11-11 ENCOUNTER — Other Ambulatory Visit: Payer: Self-pay

## 2021-11-11 DIAGNOSIS — N529 Male erectile dysfunction, unspecified: Secondary | ICD-10-CM

## 2021-11-11 MED ORDER — SILDENAFIL CITRATE 20 MG PO TABS: ORAL_TABLET | ORAL | 1 refills | Status: DC

## 2021-11-13 ENCOUNTER — Other Ambulatory Visit: Payer: Self-pay

## 2021-11-13 DIAGNOSIS — J302 Other seasonal allergic rhinitis: Secondary | ICD-10-CM

## 2021-11-14 MED ORDER — LORATADINE 10 MG PO TABS
10.0000 mg | ORAL_TABLET | Freq: Every day | ORAL | 3 refills | Status: DC | PRN
Start: 2021-11-14 — End: 2022-08-18

## 2021-11-19 ENCOUNTER — Other Ambulatory Visit: Payer: Self-pay | Admitting: Physician Assistant

## 2021-11-19 DIAGNOSIS — F411 Generalized anxiety disorder: Secondary | ICD-10-CM

## 2021-11-19 MED ORDER — SILDENAFIL CITRATE 25 MG PO TABS: 25.0000 mg | ORAL_TABLET | Freq: Every day | ORAL | 0 refills | Status: DC | PRN

## 2021-11-28 ENCOUNTER — Other Ambulatory Visit: Payer: Self-pay | Admitting: Physician Assistant

## 2021-11-28 DIAGNOSIS — I10 Essential (primary) hypertension: Secondary | ICD-10-CM

## 2021-12-07 ENCOUNTER — Other Ambulatory Visit: Payer: Self-pay | Admitting: Physician Assistant

## 2021-12-07 DIAGNOSIS — I10 Essential (primary) hypertension: Secondary | ICD-10-CM

## 2022-01-05 ENCOUNTER — Other Ambulatory Visit: Payer: Self-pay

## 2022-01-05 DIAGNOSIS — F411 Generalized anxiety disorder: Secondary | ICD-10-CM

## 2022-01-05 MED ORDER — FLUOXETINE HCL 20 MG PO TABS: 60.0000 mg | ORAL_TABLET | Freq: Every day | ORAL | 3 refills | Status: DC

## 2022-01-06 ENCOUNTER — Other Ambulatory Visit: Payer: Self-pay

## 2022-01-06 DIAGNOSIS — I10 Essential (primary) hypertension: Secondary | ICD-10-CM

## 2022-01-06 MED ORDER — RAMIPRIL 10 MG PO CAPS: 10.0000 mg | ORAL_CAPSULE | Freq: Two times a day (BID) | ORAL | 3 refills | Status: DC

## 2022-01-06 MED ORDER — METOPROLOL TARTRATE 25 MG PO TABS: 25.0000 mg | ORAL_TABLET | Freq: Two times a day (BID) | ORAL | 3 refills | Status: DC

## 2022-02-02 ENCOUNTER — Encounter (INDEPENDENT_AMBULATORY_CARE_PROVIDER_SITE_OTHER): Payer: Self-pay

## 2022-02-03 ENCOUNTER — Telehealth: Payer: Self-pay | Admitting: *Deleted

## 2022-02-03 NOTE — Chronic Care Management (AMB) (Signed)
  Care Coordination   Note   02/03/2022 Name: Halsey Hammen Buhman MRN: [id number] DOB: Jun 06, 1960  Tysean Vandervliet Mangham is a 61 y.o. year old male who sees Sable Feil, [place] for primary care. I reached out to Philomena Course by phone today to offer care coordination services.  Mr. Poplaski was given information about Care Coordination services today including:   The Care Coordination services include support from the care team which includes your Nurse Coordinator, Clinical Social Worker, or Pharmacist.  The Care Coordination team is here to help remove barriers to the health concerns and goals most important to you. Care Coordination services are voluntary, and the patient may decline or stop services at any time by request to their care team member.   Care Coordination Consent Status: Patient agreed to services and verbal consent obtained.   Follow up plan:  Telephone appointment with care coordination team member scheduled for:  02/04/22  Encounter Outcome:  Pt. Scheduled  [place]  Direct Dial: [phone]

## 2022-02-04 ENCOUNTER — Ambulatory Visit: Payer: Self-pay

## 2022-02-04 NOTE — Patient Outreach (Signed)
  Care Coordination   Initial Visit Note   02/04/2022 Name: Lenoard Helbert Vollrath MRN: [id number] DOB: [birth year]  Arish Redner Slaydon is a 61 y.o. year old male who sees Sable Feil, [place] for primary care. I spoke with  [place] by phone today.  What matters to the patients health and wellness today?  I am doing good.  My B/P is good and I see the doctor at work regularly.     Goals Addressed             This Visit's Progress    COMPLETED: Care Coordination Activities - no follow up required       Care Coordination Interventions: Advised patient to regularly follow up with his providers Provided education to patient re: HTN, care coordination services,  Assessed social determinant of health barriers Declines any further outreach as has no further needs           SDOH assessments and interventions completed:  Yes  SDOH Interventions Today    Flowsheet Row Most Recent Value  SDOH Interventions   Food Insecurity Interventions Intervention Not Indicated  Housing Interventions Intervention Not Indicated  Transportation Interventions Intervention Not Indicated  Utilities Interventions Intervention Not Indicated        Care Coordination Interventions Activated:  Yes  Care Coordination Interventions:  Yes, provided   Follow up plan: No further intervention required.   Encounter Outcome:  Pt. Visit Completed {THN Tip this will not be part of the note when signed-REQUIRED REPORT FIELD DO NOT DELETE (Optional):[id number] Peter Garter RN, BSN,CCM, [facility] [phone]

## 2022-02-04 NOTE — Patient Instructions (Signed)
Visit Information  Thank you for taking time to visit with me today. Please don't hesitate to contact me if I can be of assistance to you.   Following are the goals we discussed today:   Goals Addressed             This Visit's Progress    COMPLETED: Care Coordination Activities - no follow up required       Care Coordination Interventions: Advised patient to regularly follow up with his providers Provided education to patient re: HTN, care coordination services,  Assessed social determinant of health barriers Declines any further outreach as has no further needs            If you are experiencing a Mental Health or [place] or need someone to talk to, please call the Suicide and Crisis Lifeline: 988 call the [place] National Suicide Prevention Lifeline: 636-765-8464 or TTY: 240-586-1493 TTY 534 330 2063) to talk to a trained counselor call 1-800-273-TALK (toll free, 24 hour hotline) go to [facility] [address], [place] [phone]) call 911   Patient verbalizes understanding of instructions and care plan provided today and agrees to view in [place]. Active MyChart status and patient understanding of how to access instructions and care plan via MyChart confirmed with patient.     No further follow up required:    Peter Garter RN, Jackquline [place], [facility] [phone]

## 2022-03-12 ENCOUNTER — Other Ambulatory Visit: Payer: Self-pay

## 2022-03-12 DIAGNOSIS — F411 Generalized anxiety disorder: Secondary | ICD-10-CM

## 2022-03-13 ENCOUNTER — Other Ambulatory Visit: Payer: Self-pay

## 2022-03-13 DIAGNOSIS — F411 Generalized anxiety disorder: Secondary | ICD-10-CM

## 2022-03-13 MED ORDER — CLONAZEPAM 1 MG PO TABS: 1.0000 mg | ORAL_TABLET | Freq: Two times a day (BID) | ORAL | 3 refills | Status: DC | PRN

## 2022-03-16 ENCOUNTER — Ambulatory Visit: Payer: Self-pay | Admitting: Physician Assistant

## 2022-03-16 DIAGNOSIS — F411 Generalized anxiety disorder: Secondary | ICD-10-CM

## 2022-03-16 MED ORDER — FLUOXETINE HCL 20 MG PO TABS: 60.0000 mg | ORAL_TABLET | Freq: Every day | ORAL | 3 refills | Status: DC

## 2022-03-16 MED ORDER — CLONAZEPAM 1 MG PO TABS: 1.0000 mg | ORAL_TABLET | Freq: Two times a day (BID) | ORAL | 1 refills | Status: DC | PRN

## 2022-03-16 MED ORDER — ZOLPIDEM TARTRATE 5 MG PO TABS: 5.0000 mg | ORAL_TABLET | Freq: Every evening | ORAL | 1 refills | Status: DC | PRN

## 2022-03-16 NOTE — Progress Notes (Signed)
   Subjective: Medication refill for anxiety and insomnia.    Patient ID: Paul Koch, male    DOB: 02-07-1961, 61 y.o.   MRN: [id number]  HPI Patient here today to discuss medication adjustments for anxiety and insomnia.  Patient currently takes Prozac 20 mg daily, Klonopin 1 mg twice daily, and Ambien 10 mg nightly.  Discussed interaction of these medications.  Discussed FDA recommendations for dosage of Ambien after a 65.  Patient is amenable to medication changes reduction.   Review of Systems Anxiety, coronary artery disease, hypertension, and insomnia.    Objective:   Physical Exam  BP is 118/76, pulse 58, respiration 14, temperature 96.9, the patient is 94% O2 sat on room air.  Patient weighs 152 pounds and BMI is 22.45.      Assessment & Plan: Medication refill   Patient new prescription for Ambien will be 5 mg every 8 hours.  Patient Klonopin will be 1/2 to 1 mg daily as needed.  Patient Prozac will remain at 20 mg daily.  Patient advised to follow-up with no improvement or worsening of complaint.

## 2022-03-16 NOTE — Progress Notes (Signed)
Following with provider for Klonopin use.  States effective med use and taking as prescribed.

## 2022-05-07 ENCOUNTER — Other Ambulatory Visit: Payer: Self-pay

## 2022-05-07 ENCOUNTER — Other Ambulatory Visit: Payer: Self-pay | Admitting: Medical

## 2022-06-05 ENCOUNTER — Other Ambulatory Visit: Payer: Self-pay

## 2022-06-05 DIAGNOSIS — N529 Male erectile dysfunction, unspecified: Secondary | ICD-10-CM

## 2022-06-05 MED ORDER — SILDENAFIL CITRATE 25 MG PO TABS: 25.0000 mg | ORAL_TABLET | Freq: Every day | ORAL | 5 refills | Status: DC | PRN

## 2022-07-07 ENCOUNTER — Other Ambulatory Visit: Payer: Self-pay

## 2022-07-07 DIAGNOSIS — F411 Generalized anxiety disorder: Secondary | ICD-10-CM

## 2022-07-08 ENCOUNTER — Other Ambulatory Visit: Payer: Self-pay | Admitting: Physician Assistant

## 2022-07-08 DIAGNOSIS — F411 Generalized anxiety disorder: Secondary | ICD-10-CM

## 2022-07-08 MED ORDER — CLONAZEPAM 1 MG PO TABS: 0.5000 mg | ORAL_TABLET | Freq: Two times a day (BID) | ORAL | 3 refills | Status: DC | PRN

## 2022-07-21 ENCOUNTER — Other Ambulatory Visit: Payer: Self-pay

## 2022-07-21 DIAGNOSIS — F411 Generalized anxiety disorder: Secondary | ICD-10-CM

## 2022-07-21 MED ORDER — CLONAZEPAM 1 MG PO TABS: 1.0000 mg | ORAL_TABLET | Freq: Two times a day (BID) | ORAL | 2 refills | Status: DC | PRN

## 2022-07-21 NOTE — Telephone Encounter (Signed)
Paul Koch called the clinic today. States he & [name] previously discussed cutting back on Clonaepam from 1,0 mg to 0.5 mg bid. Tried it for 1 week and it didn't work for him.  Went back to taking 1.0 mg bid. The last Rx on 07/08/22 was prescribed at 0.5 mg bid.  He only received 30 tablets. Paul Koch is scheduled for his annual physical on 09/03/2022. States he will run out of medication before his physical. Requesting a new Rx for his old dose of 1.0 mg bid prn.  AMD

## 2022-07-28 ENCOUNTER — Ambulatory Visit: Payer: Self-pay | Admitting: Physician Assistant

## 2022-07-28 ENCOUNTER — Encounter: Payer: Self-pay | Admitting: Physician Assistant

## 2022-07-28 VITALS — BP 118/64 | HR 60 | Temp 98.0°F | Resp 14 | Ht 69.0 in | Wt 154.0 lb

## 2022-07-28 DIAGNOSIS — F411 Generalized anxiety disorder: Secondary | ICD-10-CM

## 2022-07-28 MED ORDER — ALPRAZOLAM 0.5 MG PO TABS: 0.5000 mg | ORAL_TABLET | Freq: Two times a day (BID) | ORAL | 0 refills | Status: DC | PRN

## 2022-07-28 NOTE — Progress Notes (Signed)
   Subjective: Anxiety    Patient ID: Paul Koch, male    DOB: 12/08/1960, 62 y.o.   MRN: [id number]  HPI Patient is here to discuss medication issues for anxiety.  Patient was given a trial of decreased strength of Klonopin from 1 mg twice daily to 0.5 mg twice daily.  Patient states after a week of trying to reduce doses he found his anxiety level increased.  Patient doubled his 0.5 mg to go back to his previous dosage.  Patient ran out of medication and cannot get a refill until 09 Aug 2022.  Patient is asking for bridge medication until he can refill his prescription for Klonopin at 1 mg twice daily.   Review of Systems Anxiety, coronary artery disease, and hypertension.    Objective:   Physical Exam BP 118/64  Pulse 60  Resp 14  Temp 98 F (36.7 C)  Temp src Temporal  SpO2 94 %  Weight 154 lb (69.9 kg)  Height  (1.753 m)  Physical exam was deferred.       Assessment & Plan: Anxiety.  Advised patient he will receive a 10-day supply of Xanax 0.5 mg twice daily.  Advised to refill his prescription for Klonopin on 09 Aug 2022.

## 2022-07-28 NOTE — Progress Notes (Signed)
Medication question °

## 2022-08-07 ENCOUNTER — Other Ambulatory Visit: Payer: Self-pay | Admitting: Physician Assistant

## 2022-08-07 DIAGNOSIS — E785 Hyperlipidemia, unspecified: Secondary | ICD-10-CM

## 2022-08-07 DIAGNOSIS — J302 Other seasonal allergic rhinitis: Secondary | ICD-10-CM

## 2022-08-18 ENCOUNTER — Other Ambulatory Visit: Payer: Self-pay

## 2022-08-18 NOTE — Telephone Encounter (Signed)
Onalee Hua Kruger called the [facility] requesting Rx refill request for Claritin.  Duplicate Rx refill request.  [facility] sent electronic refill requests for Claritin & Atorvastatin.  Routed the refill requests for Claritin & Atorvastatin already in Epic to [employer], PA-C.  AMD

## 2022-08-26 ENCOUNTER — Encounter: Payer: 59 | Admitting: Physician Assistant

## 2022-08-26 ENCOUNTER — Ambulatory Visit: Payer: Self-pay

## 2022-08-26 DIAGNOSIS — Z0000 Encounter for general adult medical examination without abnormal findings: Secondary | ICD-10-CM

## 2022-08-26 LAB — POCT URINALYSIS DIPSTICK
Bilirubin, UA: NEGATIVE
Blood, UA: NEGATIVE
Glucose, UA: NEGATIVE
Ketones, UA: NEGATIVE
Leukocytes, UA: NEGATIVE
Nitrite, UA: NEGATIVE
Protein, UA: NEGATIVE
Spec Grav, UA: 1.01 (ref 1.010–1.025)
Urobilinogen, UA: 0.2 E.U./dL
pH, UA: 6 (ref 5.0–8.0)

## 2022-08-27 LAB — CMP12+LP+TP+TSH+6AC+PSA+CBC…
ALT: 15 IU/L (ref 0–44)
AST: 19 IU/L (ref 0–40)
Albumin/Globulin Ratio: 1.5 (ref 1.2–2.2)
Albumin: 4.3 g/dL (ref 3.9–4.9)
Alkaline Phosphatase: 82 IU/L (ref 44–121)
BUN/Creatinine Ratio: 7 — ABNORMAL LOW (ref 10–24)
BUN: 8 mg/dL (ref 8–27)
Basophils Absolute: 0.1 10*3/uL (ref 0.0–0.2)
Basos: 1 %
Bilirubin Total: 0.6 mg/dL (ref 0.0–1.2)
Calcium: 9.2 mg/dL (ref 8.6–10.2)
Chloride: 95 mmol/L — ABNORMAL LOW (ref 96–106)
Chol/HDL Ratio: 2.8 ratio (ref 0.0–5.0)
Cholesterol, Total: 117 mg/dL (ref 100–199)
Creatinine, Ser: 1.07 mg/dL (ref 0.76–1.27)
EOS (ABSOLUTE): 0.2 10*3/uL (ref 0.0–0.4)
Eos: 2 %
Estimated CHD Risk: <0.5 times avg. (ref 0.0–1.0)
Free Thyroxine Index: 2.4 (ref 1.2–4.9)
GGT: 11 IU/L (ref 0–65)
Globulin, Total: 2.8 g/dL (ref 1.5–4.5)
Glucose: 86 mg/dL (ref 70–99)
HDL: 42 mg/dL (ref >39)
Hematocrit: 45 % (ref 37.5–51.0)
Hemoglobin: 15.4 g/dL (ref 13.0–17.7)
Immature Grans (Abs): 0 10*3/uL (ref 0.0–0.1)
Immature Granulocytes: 0 %
Iron: 65 ug/dL (ref 38–169)
LDH: 177 IU/L (ref 121–224)
LDL Chol Calc (NIH): 59 mg/dL (ref 0–99)
Lymphocytes Absolute: 2.9 10*3/uL (ref 0.7–3.1)
Lymphs: 30 %
MCH: 29.1 pg (ref 26.6–33.0)
MCHC: 34.2 g/dL (ref 31.5–35.7)
MCV: 85 fL (ref 79–97)
Monocytes Absolute: 0.6 10*3/uL (ref 0.1–0.9)
Monocytes: 7 %
Neutrophils Absolute: 5.9 10*3/uL (ref 1.4–7.0)
Neutrophils: 60 %
Phosphorus: 3.6 mg/dL (ref 2.8–4.1)
Platelets: 248 10*3/uL (ref 150–450)
Potassium: 5 mmol/L (ref 3.5–5.2)
Prostate Specific Ag, Serum: 1.7 ng/mL (ref 0.0–4.0)
RBC: 5.29 x10E6/uL (ref 4.14–5.80)
RDW: 13 % (ref 11.6–15.4)
Sodium: 136 mmol/L (ref 134–144)
T3 Uptake Ratio: 26 % (ref 24–39)
T4, Total: 9.3 ug/dL (ref 4.5–12.0)
TSH: 1.76 u[IU]/mL (ref 0.450–4.500)
Total Protein: 7.1 g/dL (ref 6.0–8.5)
Triglycerides: 81 mg/dL (ref 0–149)
Uric Acid: 4.9 mg/dL (ref 3.8–8.4)
VLDL Cholesterol Cal: 16 mg/dL (ref 5–40)
WBC: 9.8 10*3/uL (ref 3.4–10.8)
eGFR: 77 mL/min/{1.73_m2} (ref >59)

## 2022-09-03 ENCOUNTER — Ambulatory Visit: Payer: Self-pay | Admitting: Physician Assistant

## 2022-09-03 ENCOUNTER — Encounter: Payer: 59 | Admitting: Physician Assistant

## 2022-09-03 ENCOUNTER — Other Ambulatory Visit: Payer: Self-pay

## 2022-09-03 ENCOUNTER — Encounter: Payer: Self-pay | Admitting: Physician Assistant

## 2022-09-03 ENCOUNTER — Other Ambulatory Visit: Payer: Self-pay | Admitting: Physician Assistant

## 2022-09-03 VITALS — BP 128/77 | HR 68 | Temp 97.7°F | Resp 16 | Wt 153.2 lb

## 2022-09-03 DIAGNOSIS — Z0000 Encounter for general adult medical examination without abnormal findings: Secondary | ICD-10-CM

## 2022-09-03 DIAGNOSIS — G4700 Insomnia, unspecified: Secondary | ICD-10-CM

## 2022-09-03 DIAGNOSIS — F5102 Adjustment insomnia: Secondary | ICD-10-CM

## 2022-09-03 MED ORDER — ZOLPIDEM TARTRATE 5 MG PO TABS: 5.0000 mg | ORAL_TABLET | Freq: Every evening | ORAL | 0 refills | Status: AC | PRN

## 2022-09-03 NOTE — Progress Notes (Signed)
Here for yearly physical with provider with [facility] and stated works different shifts.  Taking meds as ordered he stated.

## 2022-09-03 NOTE — Progress Notes (Signed)
[facility]  __________________________   None    (approximate)  I have reviewed the triage vital signs and the nursing notes.   HISTORY  Chief Complaint No chief complaint on file.  { HPI Paul Koch is a 62 y.o. male patient presents for annual physical exam.  Patient was no concerns of complaints.  Patient request refill of Ambien.         Past Medical History:  Diagnosis Date   AAA (abdominal aortic aneurysm) (HCC)    Anxiety disorder    CAD (coronary artery disease)    COPD (chronic obstructive pulmonary disease) (HCC)    Eczema    ED (erectile dysfunction)    Elevated lipids    Hyperplastic colon polyp 2014   due 2024   Hypertension    Lung nodule     Patient Active Problem List   Diagnosis Date Noted   Non-functional thyroid nodule 11/17/2018   Sinus bradycardia 02/09/2018   Pain in both lower extremities 02/10/2017   Coronary artery disease 05/14/2016   Hyperlipidemia LDL goal <70 05/14/2016   Arteriosclerosis of abdominal aorta (HCC) 05/14/2016   Solitary lung nodule 05/14/2016   ED (erectile dysfunction) 03/18/2016   Abnormal stress test    Insomnia 01/09/2009   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 08/06/2008   ECZEMA 08/06/2008   DERMATOPHYTOSIS OF NAIL 07/14/2007   INGROWING NAIL 07/14/2007   ANXIETY DISORDER 06/02/2007   Essential hypertension 06/02/2007    Past Surgical History:  Procedure Laterality Date   CARDIAC CATHETERIZATION N/A 01/30/2016   Procedure: Left Heart Cath and Coronary Angiography;  Surgeon: Corky Crafts, MD;  Location: [facility] INVASIVE CV LAB;  Service: Cardiovascular;  Laterality: N/A;   CARDIAC CATHETERIZATION N/A 01/30/2016   Procedure: Coronary Stent Intervention;  Surgeon: Corky Crafts, MD;  Location: [facility] INVASIVE CV LAB;  Service: Cardiovascular;  Laterality: N/A;   TONSILLECTOMY AND ADENOIDECTOMY     VASECTOMY      Prior to Admission medications    Medication Sig Start Date End Date Taking? Authorizing Provider  albuterol (VENTOLIN HFA) 108 (90 Base) MCG/ACT inhaler INHALE 1-2 PUFFS INTO THE LUNGS EVERY 6 (SIX) HOURS AS NEEDED FOR SHORTNESS OF BREATH. Patient taking differently: Inhale 1-2 puffs into the lungs as needed for shortness of breath. 10/02/20   Johnna Acosta, NP  ALPRAZolam Prudy Feeler) 0.5 MG tablet Take 1 tablet (0.5 mg total) by mouth 2 (two) times daily as needed for anxiety. 07/28/22   Joni Reining, PA-C  amLODipine (NORVASC) 5 MG tablet Take 1 tablet (5 mg total) by mouth 2 (two) times daily. 05/07/22   Furth, Cadence H, PA-C  ASPIRIN LOW DOSE 81 MG EC tablet TAKE 1 TABLET BY MOUTH EVERY DAY 03/20/20   Joni Reining, PA-C  atorvastatin (LIPITOR) 20 MG tablet TAKE 1 TABLET BY MOUTH EVERY DAY 09/06/20   Joni Reining, PA-C  atorvastatin (LIPITOR) 20 MG tablet TAKE 1 TABLET BY MOUTH EVERY DAY 08/18/22   Joni Reining, PA-C  benzonatate (TESSALON PERLES) 100 MG capsule Take 1 capsule (100 mg total) by mouth 3 (three) times daily as needed for cough. Patient not taking: Reported on 03/16/2022 10/23/21   Joni Reining, PA-C  FLUoxetine (PROZAC) 20 MG tablet Take 3 tablets (60 mg total) by mouth daily. 03/16/22   Joni Reining, PA-C  loratadine (CLARITIN) 10 MG tablet TAKE 1 TABLET BY MOUTH EVERY DAY AS NEEDED FOR ALLERGY 08/18/22  Joni Reining, PA-C  metoprolol tartrate (LOPRESSOR) 25 MG tablet Take 1 tablet (25 mg total) by mouth 2 (two) times daily. 01/06/22   Joni Reining, PA-C  ramipril (ALTACE) 10 MG capsule Take 1 capsule (10 mg total) by mouth 2 (two) times daily. 01/06/22   Joni Reining, PA-C  sildenafil (VIAGRA) 25 MG tablet Take 1 tablet (25 mg total) by mouth daily as needed for erectile dysfunction. 06/05/22   Joni Reining, PA-C  zolpidem (AMBIEN) 5 MG tablet Take 1 tablet (5 mg total) by mouth at bedtime as needed for sleep. 03/16/22   Joni Reining, PA-C    Allergies Clindamycin hcl  Family History   Problem Relation Age of Onset   Brain cancer Mother    Lung cancer Mother    Arrhythmia Mother    Hypertension Mother    Hyperlipidemia Mother    Bladder Cancer Mother    Stroke Mother    Emphysema Father     Social History Social History   Tobacco Use   Smoking status: Former    Packs/day: 1.00    Years: 20.00    Additional pack years: 0.00    Total pack years: 20.00    Types: Cigarettes    Quit date: 2004    Years since quitting: 20.4   Smokeless tobacco: Never  Vaping Use   Vaping Use: Never used  Substance Use Topics   Alcohol use: No    Comment: social (5 drinks/year)   Drug use: No    Review of Systems Constitutional: No fever/chills Eyes: No visual changes. ENT: No sore throat. Cardiovascular: Denies chest pain.  Coronary artery disease Respiratory: Denies shortness of breath. Gastrointestinal: No abdominal pain.  No nausea, no vomiting.  No diarrhea.  No constipation. Genitourinary: Negative for dysuria. Musculoskeletal: Negative for back pain. Skin: Negative for rash. Neurological: Negative for headaches, focal weakness or numbness. Psychiatric: Anxiety Endocrine: Hyperlipidemia and hypertension Allergic/Immunilogical: Clindamycin ____________________________________________   PHYSICAL EXAM:  VITAL SIGNS: BP 128/77  Pulse 68  Resp 16  Temp 97.7 F (36.5 C)  SpO2 94 %  Weight 153 lb 3.2 oz (69.5 kg)   BMI 22.62 kg/m2  BSA 1.84 m2     Constitutional: Alert and oriented. Well appearing and in no acute distress. Eyes: Conjunctivae are normal. PERRL. EOMI. Head: Atraumatic. Nose: No congestion/rhinnorhea. Mouth/Throat: Mucous membranes are moist.  Oropharynx non-erythematous. Neck: No stridor.  No cervical spine tenderness to palpation. Hematological/Lymphatic/Immunilogical: No cervical lymphadenopathy. Cardiovascular: Normal rate, regular rhythm. Grossly normal heart sounds.  Good peripheral circulation. Respiratory: Normal respiratory  effort.  No retractions. Lungs CTAB. Gastrointestinal: Soft and nontender. No distention. No abdominal bruits. No CVA tenderness. Genitourinary: Deferred Musculoskeletal: No lower extremity tenderness nor edema.  No joint effusions. Neurologic:  Normal speech and language. No gross focal neurologic deficits are appreciated. No gait instability. Skin:  Skin is warm, dry and intact. No rash noted. Psychiatric: Mood and affect are normal. Speech and behavior are normal.  ____________________________________________   LABS       Component Ref Range & Units 8 d ago 12 mo ago 3 yr ago  Color, UA amber yellow Dark Yellow  Clarity, UA clear clear Clear  Glucose, UA Negative Negative Negative Negative  Bilirubin, UA neg negative Negative  Ketones, UA neg negative Negative  Spec Grav, UA 1.010 - 1.025 1.010 1.020 1.025  Blood, UA neg negative Negative  pH, UA 5.0 - 8.0 6.0 6.0 6.0  Protein, UA Negative Negative Negative  Positive Abnormal  CM  Urobilinogen, UA 0.2 or 1.0 E.U./dL 0.2 0.2 0.2  Nitrite, UA neg negative Negative  Leukocytes, UA Negative Negative Negative Negative  Appearance dark    Odor strong                   Component Ref Range & Units 8 d ago (08/26/22) 12 mo ago (09/05/21) 2 yr ago (05/28/20) 3 yr ago (05/05/19) 6 yr ago (02/05/16) 6 yr ago (02/05/16) 6 yr ago (01/28/16) 6 yr ago (01/28/16)  Glucose 70 - 99 mg/dL 86 132 High  96 R 88 R 78 R  104 High  R   Uric Acid 3.8 - 8.4 mg/dL 4.9 6.0 CM 4.7 CM 5.0 CM      Comment:            Therapeutic target for gout patients: <6.0  BUN 8 - 27 mg/dL 8 15 11 13 17  R  18 R   Creatinine, Ser 0.76 - 1.27 mg/dL 4.40 1.02 7.25 CM 3.66 0.94  1.13 R   eGFR >59 mL/min/1.73 77 85        BUN/Creatinine Ratio 10 - 24 7 Low  15 12 14 18  R     Sodium 134 - 144 mmol/L 136 138 138 139 135  137 R   Potassium 3.5 - 5.2 mmol/L 5.0 5.0 4.8 5.0 4.9  4.4 R   Chloride 96 - 106 mmol/L 95 Low  97 100 100 95 Low   100 Low  R    Calcium 8.6 - 10.2 mg/dL 9.2 9.1 8.6 8.8 8.9 R  9.2 R   Phosphorus 2.8 - 4.1 mg/dL 3.6 3.3 3.3 3.9      Total Protein 6.0 - 8.5 g/dL 7.1 6.8 6.8 6.6 6.7     Albumin 3.9 - 4.9 g/dL 4.3 4.1 R 4.2 R 4.3 R 4.1 R     Globulin, Total 1.5 - 4.5 g/dL 2.8 2.7 2.6 2.3 2.6     Albumin/Globulin Ratio 1.2 - 2.2 1.5 1.5 1.6 1.9 1.6     Bilirubin Total 0.0 - 1.2 mg/dL 0.6 0.6 0.4 0.4 0.3     Alkaline Phosphatase 44 - 121 IU/L 82 87 76 75 R 61 R     LDH 121 - 224 IU/L 177 153 156 164      AST 0 - 40 IU/L 19 18 16 20 17      ALT 0 - 44 IU/L 15 15 16 20 19      GGT 0 - 65 IU/L 11 13 20 15       Iron 38 - 169 ug/dL 65 55 74 76      Cholesterol, Total 100 - 199 mg/dL 440 347 425 956  387    Triglycerides 0 - 149 mg/dL 81 564 96 89  332 High     HDL >39 mg/dL 42 33 Low  33 Low  34 Low   29 Low     VLDL Cholesterol Cal 5 - 40 mg/dL 16 21 18 17   57 High     LDL Chol Calc (NIH) 0 - 99 mg/dL 59 57 64 60      Chol/HDL Ratio 0.0 - 5.0 ratio 2.8 3.4 CM 3.5 CM 3.3 CM  5.6 High  R, CM    Comment:  T. Chol/HDL Ratio                                             Men  Women                               1/2 Avg.Risk  3.4    3.3                                   Avg.Risk  5.0    4.4                                2X Avg.Risk  9.6    7.1                                3X Avg.Risk 23.4   11.0  Estimated CHD Risk 0.0 - 1.0 times avg.  < 0.5 0.5 CM 0.6 CM  < 0.5 CM      Comment: The CHD Risk is based on the T. Chol/HDL ratio. Other factors affect CHD Risk such as hypertension, smoking, diabetes, severe obesity, and family history of premature CHD.  TSH 0.450 - 4.500 uIU/mL 1.760 0.585 0.765 0.795      T4, Total 4.5 - 12.0 ug/dL 9.3 9.7 9.0 7.9      T3 Uptake Ratio 24 - 39 % 26 30 29 28       Free Thyroxine Index 1.2 - 4.9 2.4 2.9 2.6 2.2      Prostate Specific Ag, Serum 0.0 - 4.0 ng/mL 1.7 2.5 CM 1.2 CM 1.8 CM      Comment: Roche ECLIA methodology. According to the  American Urological Association, Serum PSA should decrease and remain at undetectable levels after radical prostatectomy. The AUA defines biochemical recurrence as an initial PSA value 0.2 ng/mL or greater followed by a subsequent confirmatory PSA value 0.2 ng/mL or greater. Values obtained with different assay methods or kits cannot be used interchangeably. Results cannot be interpreted as absolute evidence of the presence or absence of malignant disease.  WBC 3.4 - 10.8 x10E3/uL 9.8 7.9 7.0 7.6    8.8 R  RBC 4.14 - 5.80 x10E6/uL 5.29 5.13 4.93 5.15    5.35 R  Hemoglobin 13.0 - 17.7 g/dL 46.9 62.9 52.8 41.3    15.8 R  Hematocrit 37.5 - 51.0 % 45.0 44.0 43.4 44.8    45.8 R  MCV 79 - 97 fL 85 86 88 87    85.6 R  MCH 26.6 - 33.0 pg 29.1 29.2 30.0 28.5    29.5 R  MCHC 31.5 - 35.7 g/dL 24.4 01.0 27.2 53.6    34.4 R  RDW 11.6 - 15.4 % 13.0 13.0 13.9 13.5    13.6 R  Platelets 150 - 450 x10E3/uL 248 230 198 238    204 R  Neutrophils Not Estab. % 60 67 61 61      Lymphs Not Estab. % 30 24 31 31       Monocytes Not Estab. % 7 7 6 5       Eos Not Estab. % 2 1 2  2  Basos Not Estab. % 1 1 0 1      Neutrophils Absolute 1.4 - 7.0 x10E3/uL 5.9 5.2 4.2 4.6      Lymphocytes Absolute 0.7 - 3.1 x10E3/uL 2.9 1.9 2.2 2.4      Monocytes Absolute 0.1 - 0.9 x10E3/uL 0.6 0.5 0.4 0.4      EOS (ABSOLUTE) 0.0 - 0.4 x10E3/uL 0.2 0.1 0.1 0.1      Basophils Absolute 0.0 - 0.2 x10E3/uL 0.1 0.1 0.0 0.0      Immature Granulocytes Not Estab. % 0 0 0 0      Immature Grans (Abs)              ___________________________________________  EKG  Sinus bradycardia 45 bpm asymptomatic ____________________________________________    ____________________________________________   INITIAL IMPRESSION / ASSESSMENT AND PLAN   As part of my medical decision making, I reviewed the following data within the electronic MEDICAL RECORD NUMBER  No acute findings on physical exam, EKG, or labs.  Patient  prescription Ambien is refilled.            ____________________________________________   FINAL CLINICAL IMPRESSION    ED Discharge Orders     None        Note:  This document was prepared using Dragon voice recognition software and may include unintentional dictation errors.

## 2022-10-07 ENCOUNTER — Other Ambulatory Visit: Payer: Self-pay | Admitting: Physician Assistant

## 2022-10-07 DIAGNOSIS — I10 Essential (primary) hypertension: Secondary | ICD-10-CM

## 2022-11-04 ENCOUNTER — Other Ambulatory Visit: Payer: Self-pay

## 2022-11-04 DIAGNOSIS — F411 Generalized anxiety disorder: Secondary | ICD-10-CM

## 2022-11-04 MED ORDER — CLONAZEPAM 1 MG PO TABS
1.0000 mg | ORAL_TABLET | Freq: Two times a day (BID) | ORAL | 2 refills | Status: DC | PRN
Start: 2022-11-04 — End: 2023-02-03

## 2023-01-04 ENCOUNTER — Other Ambulatory Visit: Payer: Self-pay

## 2023-01-04 DIAGNOSIS — I10 Essential (primary) hypertension: Secondary | ICD-10-CM

## 2023-01-04 MED ORDER — METOPROLOL TARTRATE 25 MG PO TABS: 25.0000 mg | ORAL_TABLET | Freq: Two times a day (BID) | ORAL | 3 refills | Status: DC

## 2023-01-04 MED ORDER — RAMIPRIL 10 MG PO CAPS
10.0000 mg | ORAL_CAPSULE | Freq: Two times a day (BID) | ORAL | 3 refills | Status: DC
Start: 2023-01-04 — End: 2023-11-04

## 2023-01-07 ENCOUNTER — Other Ambulatory Visit: Payer: Self-pay | Admitting: Medical

## 2023-01-07 ENCOUNTER — Other Ambulatory Visit: Payer: Self-pay | Admitting: Physician Assistant

## 2023-01-07 DIAGNOSIS — F411 Generalized anxiety disorder: Secondary | ICD-10-CM

## 2023-02-03 ENCOUNTER — Other Ambulatory Visit: Payer: Self-pay

## 2023-02-03 DIAGNOSIS — F411 Generalized anxiety disorder: Secondary | ICD-10-CM

## 2023-02-03 MED ORDER — CLONAZEPAM 1 MG PO TABS: 1.0000 mg | ORAL_TABLET | Freq: Two times a day (BID) | ORAL | 2 refills | Status: DC | PRN

## 2023-03-24 ENCOUNTER — Telehealth: Payer: Self-pay

## 2023-03-24 NOTE — Telephone Encounter (Signed)
S/Sx started a few days ago: Started with a runny nose Scratchy throat - sinus drainage Sweating - arm pits No fever that he's aware of - hasn't checked temp with thermometer   Pressure behind eyes  Advised to get Allegra D & take bid Ibuprofen two tabs  If S/Sx not getting better or worsen call the clinic or go to urgent care  States he works Thursday & Friday nights.  If S/Sx not getting better or worsening, he will call the clinic on Monday to be seen by [name].  AMD

## 2023-03-27 ENCOUNTER — Other Ambulatory Visit: Payer: Self-pay | Admitting: Medical

## 2023-03-29 ENCOUNTER — Other Ambulatory Visit: Payer: Self-pay

## 2023-03-29 DIAGNOSIS — F411 Generalized anxiety disorder: Secondary | ICD-10-CM

## 2023-03-29 MED ORDER — FLUOXETINE HCL 20 MG PO TABS
60.0000 mg | ORAL_TABLET | Freq: Every day | ORAL | 3 refills | Status: DC
Start: 2023-03-29 — End: 2023-09-10

## 2023-03-29 NOTE — Telephone Encounter (Signed)
Please contact pt for future appointment. Pt overdue for follow up.

## 2023-04-01 NOTE — Telephone Encounter (Signed)
Unable to LVM.

## 2023-04-05 ENCOUNTER — Other Ambulatory Visit: Payer: Self-pay | Admitting: Internal Medicine

## 2023-04-06 NOTE — Telephone Encounter (Signed)
Please contact pt for future appointment. Pt overdue for 12 month f/u. Pt needing refills and must be seen yearly.

## 2023-04-21 ENCOUNTER — Ambulatory Visit: Payer: 59 | Attending: Medical | Admitting: Medical

## 2023-04-21 ENCOUNTER — Encounter: Payer: Self-pay | Admitting: Medical

## 2023-04-21 VITALS — BP 140/90 | HR 65 | Ht 69.0 in | Wt 157.5 lb

## 2023-04-21 DIAGNOSIS — I719 Aortic aneurysm of unspecified site, without rupture: Secondary | ICD-10-CM

## 2023-04-21 DIAGNOSIS — I2510 Atherosclerotic heart disease of native coronary artery without angina pectoris: Secondary | ICD-10-CM

## 2023-04-21 DIAGNOSIS — E782 Mixed hyperlipidemia: Secondary | ICD-10-CM

## 2023-04-21 DIAGNOSIS — I10 Essential (primary) hypertension: Secondary | ICD-10-CM | POA: Diagnosis not present

## 2023-04-21 MED ORDER — AMLODIPINE BESYLATE 10 MG PO TABS: 10.0000 mg | ORAL_TABLET | Freq: Every day | ORAL | 3 refills | Status: DC

## 2023-04-21 NOTE — Progress Notes (Signed)
 Cardiology Office Note:    Date:  04/21/2023   ID:  Paul Koch, DOB Jan 23, 1961, MRN [id number]  PCP:  Paul Koch  [facility] Cardiologist:  Paul Crisp, MD  [facility] Electrophysiologist:  None   Referring MD: Paul Severe, PA-C   Chief Complaint: 1 year follow-up  History of Present Illness:    Paul Koch is a 63 y.o. male with a hx of CAD s/p PCI to [facility] 01/2016, HTN, AAA, HLD, bradycardia on BB, anixiety, solitary lung nodule (8mm) monitored by CT, thryoid nodule with recommendation forbx deferred, and remote smoking history who presents for CAD follow-up.   He underwent MPI 01/2016 tht showed moderate size basal inferior septal, mid, inf, septal, mid inf and apical defect consistend with iscemia or scar. Treadmill test showed ST depression in the inferior leads. LVEF 65%. He underwent LHC and had PCI 01/2016. He was treated with DES to [facility] LVEF 55-65%. He underwent abdominal US  03/2016 showed atherosclerosis plaque of the abdominal aorta at the upper limits, 2.9cm with most recent 2019 showed 3.1cm.   The patient was last seen 08/2021 and was overall doing well.  He reported worsening COPD.  Blood pressure was borderline and amlodipine  was decreased.  Today, the patient reports he is overall OK. He is getting ready to retire, which is causing some stress in his life. IF he exerts himself too much he may have chest pressure. He uses the inhaler once every 4-5 days. He is cautious with lifting. He has chest pressure rarely. He feels symptoms are driven by his breathing issues. No lower leg edema, fever, chills, orthopnea or pnd. BP is a little high. He eats fairly healthy.   Past Medical History:  Diagnosis Date   AAA (abdominal aortic aneurysm) (HCC)    Anxiety disorder    CAD (coronary artery disease)    COPD (chronic obstructive pulmonary disease) (HCC)    Eczema    ED (erectile dysfunction)    Elevated lipids    Hyperplastic colon polyp 2014    due 2024   Hypertension    Lung nodule     Past Surgical History:  Procedure Laterality Date   CARDIAC CATHETERIZATION N/A 01/30/2016   Procedure: Left Heart Cath and Coronary Angiography;  Surgeon: Lucendia Rusk, MD;  Location: [facility] INVASIVE CV LAB;  Service: Cardiovascular;  Laterality: N/A;   CARDIAC CATHETERIZATION N/A 01/30/2016   Procedure: Coronary Stent Intervention;  Surgeon: Lucendia Rusk, MD;  Location: [facility] INVASIVE CV LAB;  Service: Cardiovascular;  Laterality: N/A;   TONSILLECTOMY AND ADENOIDECTOMY     VASECTOMY      Current Medications: Current Meds  Medication Sig   albuterol  (VENTOLIN  HFA) 108 (90 Base) MCG/ACT inhaler INHALE 1-2 PUFFS INTO THE LUNGS EVERY 6 (SIX) HOURS AS NEEDED FOR SHORTNESS OF BREATH. (Patient taking differently: Inhale 1-2 puffs into the lungs as needed for shortness of breath.)   amLODipine  (NORVASC ) 5 MG tablet TAKE 1 TABLET (5 MG TOTAL) BY MOUTH DAILY.   ASPIRIN  LOW DOSE 81 MG EC tablet TAKE 1 TABLET BY MOUTH EVERY DAY   atorvastatin  (LIPITOR) 20 MG tablet TAKE 1 TABLET BY MOUTH EVERY DAY   clonazePAM  (KLONOPIN ) 1 MG tablet Take 1 tablet (1 mg total) by mouth 2 (two) times daily as needed for anxiety.   FLUoxetine  (PROZAC ) 20 MG tablet Take 3 tablets (60 mg total) by mouth daily.   loratadine  (CLARITIN ) 10 MG tablet TAKE 1 TABLET BY MOUTH EVERY DAY  AS NEEDED FOR ALLERGY   metoprolol  tartrate (LOPRESSOR ) 25 MG tablet Take 1 tablet (25 mg total) by mouth 2 (two) times daily.   ramipril  (ALTACE ) 10 MG capsule Take 1 capsule (10 mg total) by mouth 2 (two) times daily.   sildenafil  (VIAGRA ) 25 MG tablet Take 1 tablet (25 mg total) by mouth daily as needed for erectile dysfunction.   zolpidem  (AMBIEN ) 5 MG tablet Take 1 tablet (5 mg total) by mouth at bedtime as needed for sleep.     Allergies:   Clindamycin hcl   Social History   Socioeconomic History   Marital status: Married    Spouse name: Not on file   Number of children: Not on  file   Years of education: Not on file   Highest education level: Not on file  Occupational History   Not on file  Tobacco Use   Smoking status: Former    Current packs/day: 0.00    Average packs/day: 1 pack/day for 20.0 years (20.0 ttl pk-yrs)    Types: Cigarettes    Start date: 73    Quit date: 2004    Years since quitting: 21.0   Smokeless tobacco: Never  Vaping Use   Vaping status: Never Used  Substance and Sexual Activity   Alcohol use: No    Comment: social (5 drinks/year)   Drug use: No   Sexual activity: Not on file  Other Topics Concern   Not on file  Social History Narrative   Not on file   Social Drivers of Health   Financial Resource Strain: Not on file  Food Insecurity: No Food Insecurity (02/04/2022)   Hunger Vital Sign    Worried About Running Out of Food in the Last Year: Never true    Ran Out of Food in the Last Year: Never true  Transportation Needs: No Transportation Needs (02/04/2022)   PRAPARE - Administrator, Civil Service (Medical): No    Lack of Transportation (Non-Medical): No  Physical Activity: Not on file  Stress: Not on file  Social Connections: Not on file     Family History: The patient's family history includes Arrhythmia in his mother; Bladder Cancer in his mother; Brain cancer in his mother; Emphysema in his father; Hyperlipidemia in his mother; Hypertension in his mother; Lung cancer in his mother; Stroke in his mother.  ROS:   Please see the history of present illness.     All other systems reviewed and are negative.  EKGs/Labs/Other Studies Reviewed:    The following studies were reviewed today:  Echo 2021  1. Left ventricular ejection fraction, by estimation, is 55 to 60%. The  left ventricle has normal function. The left ventricle has no regional  wall motion abnormalities. Left ventricular diastolic parameters were  normal.   2. Right ventricular systolic function is normal. The right ventricular  size is  normal.   3. The mitral valve is normal in structure. No evidence of mitral valve  regurgitation. No evidence of mitral stenosis.   4. The aortic valve is normal in structure. Aortic valve regurgitation is  not visualized. No aortic stenosis is present.   5. The inferior vena cava is normal in size with greater than 50%  respiratory variability, suggesting right atrial pressure of 3 mmHg.   EKG:  EKG is ordered today.  The ekg ordered today demonstrates NSR 65 bpm, nonspecific ST changes  Recent Labs: 08/26/2022: ALT 15; BUN 8; Creatinine, Ser 1.07; Hemoglobin 15.4; Platelets 248;  Potassium 5.0; Sodium 136; TSH 1.760  Recent Lipid Panel    Component Value Date/Time   CHOL 117 08/26/2022 0916   TRIG 81 08/26/2022 0916   HDL 42 08/26/2022 0916   CHOLHDL 2.8 08/26/2022 0916   CHOLHDL 5 07/31/2008 0958   VLDL 32.6 07/31/2008 0958   LDLCALC 59 08/26/2022 0916    Physical Exam:    VS:  BP (!) 140/90 (BP Location: Left Arm, Patient Position: Sitting, Cuff Size: Normal)   Pulse 65   Ht 5\' 9"  (1.753 m)   Wt 157 lb 8 oz (71.4 kg)   SpO2 95%   BMI 23.26 kg/m     Wt Readings from Last 3 Encounters:  04/21/23 157 lb 8 oz (71.4 kg)  09/03/22 153 lb 3.2 oz (69.5 kg)  07/28/22 154 lb (69.9 kg)     GEN:  Well nourished, well developed in no acute distress HEENT: Normal NECK: No JVD; No carotid bruits LYMPHATICS: No lymphadenopathy CARDIAC: RRR, no murmurs, rubs, gallops RESPIRATORY:  Clear to auscultation without rales, wheezing or rhonchi  ABDOMEN: Soft, non-tender, non-distended MUSCULOSKELETAL:  No edema; No deformity  SKIN: Warm and dry NEUROLOGIC:  Alert and oriented x 3 PSYCHIATRIC:  Normal affect   ASSESSMENT:    1. Coronary artery disease involving native coronary artery of native heart without angina pectoris   2. Hyperlipidemia, mixed   3. Aortic aneurysm without rupture, unspecified portion of aorta (HCC)   4. Essential hypertension    PLAN:    In order of  problems listed above:  CAD s/p DES mRCA in 2017 Patient reports intermittent chest pressure with overexertion. Says it may stem from breathing issues/COPD. I will increase amlodipine  to 10mg  daily. He is also using sildenafil  occasionally. Continue Aspirin  81mg  daily, Lipitor 20mg  daily, and Lopressor  25mg  BID.  HLD LDL 59. Continue Lipitor 20mg  daily.   AAA Repeat US  AAA. Most recent is 3.7cm.  HTN BP is mildly elevated today. I will increase amlodipine  as above.  Continue ramipril  and Lopressor .  Disposition: Follow up in 1 year(s) with MD/APP    Signed, Chauncey Bruno Rebekah Canada, PA-C  04/21/2023 8:59 AM    [facility]

## 2023-04-21 NOTE — Patient Instructions (Signed)
 Medication Instructions:   Increase Amlodipine  to 10 MG daily.  *If you need a refill on your cardiac medications before your next appointment, please call your pharmacy*   Lab Work: None ordered.  If you have labs (blood work) drawn today and your tests are completely normal, you will receive your results only by: MyChart Message (if you have MyChart) OR A paper copy in the mail If you have any lab test that is abnormal or we need to change your treatment, we will call you to review the results.   Testing/Procedures: Your physician has recommended that you have an AORTA/ILIAC Duplex. This is a noninvasive diagnostic test that uses ultrasound technology to look at the aorta and iliac arteries to detect any restriction to blood flow to the buttocks, groin, legs or feet.  No food after 11PM the night before.  Water is OK. (Don't drink liquids if you have been instructed not to for ANOTHER test). Avoid foods that produce bowel gas, for 24 hours prior to exam (see below). No breakfast, no chewing gum, no smoking or carbonated beverages. Patient may take morning medications with water. Come in for test at least 15 minutes early to register. This will take approximately 60 minutes This will take place at [address] [facility], [place] [address]  Please note: We ask at that you not bring children with you during ultrasound (echo/ vascular) testing. Due to room size and safety concerns, children are not allowed in the ultrasound rooms during exams. Our front office staff cannot provide observation of children in our lobby area while testing is being conducted. An adult accompanying a patient to their appointment will only be allowed in the ultrasound room at the discretion of the ultrasound technician under special circumstances. We apologize for any inconvenience.    Follow-Up: At [facility], you and your health needs are our priority.  As part of our  continuing mission to provide you with exceptional heart care, we have created designated Provider Care Teams.  These Care Teams include your primary Cardiologist (physician) and Advanced Practice Providers (APPs -  Physician Assistants and Nurse Practitioners) who all work together to provide you with the care you need, when you need it.  We recommend signing up for the patient portal called "MyChart".  Sign up information is provided on this After Visit Summary.  MyChart is used to connect with patients for Virtual Visits (Telemedicine).  Patients are able to view lab/test results, encounter notes, upcoming appointments, etc.  Non-urgent messages can be sent to your provider as well.   To learn more about what you can do with MyChart, go to [email or web address].    Your next appointment:   1 year(s)  Provider:   You may see Sammy Crisp, MD or one of the following Advanced Practice Providers on your designated Care Team:   Laneta Pintos, NP Varney Gentleman, PA-C Cadence Gennaro Khat, PA-C Ronald Cockayne, NP Morey Ar, NP

## 2023-04-21 NOTE — Progress Notes (Deleted)
  The patient reports he is overall OK. He is getting ready to retire, which is causing some stress in his life. IF he exerts himself too much he may have chest pressure. He uses the inhaler once every 4-5 days. He is cautious with lifting. He has chest pressure rarely. He feels symptoms are driven by his breathing issues.  No lower leg edema, fever, chills, orthopnea or pnd. BP is a little high. He eats fairly healthy.

## 2023-05-05 ENCOUNTER — Other Ambulatory Visit: Payer: Self-pay

## 2023-05-05 DIAGNOSIS — F411 Generalized anxiety disorder: Secondary | ICD-10-CM

## 2023-05-05 MED ORDER — CLONAZEPAM 1 MG PO TABS: 1.0000 mg | ORAL_TABLET | Freq: Two times a day (BID) | ORAL | 0 refills | Status: DC | PRN

## 2023-05-13 ENCOUNTER — Other Ambulatory Visit: Payer: 59

## 2023-05-27 ENCOUNTER — Ambulatory Visit: Payer: 59 | Attending: Medical

## 2023-05-27 DIAGNOSIS — I714 Abdominal aortic aneurysm, without rupture, unspecified: Secondary | ICD-10-CM | POA: Diagnosis not present

## 2023-05-27 DIAGNOSIS — I719 Aortic aneurysm of unspecified site, without rupture: Secondary | ICD-10-CM

## 2023-05-31 ENCOUNTER — Other Ambulatory Visit: Payer: Self-pay

## 2023-05-31 DIAGNOSIS — I719 Aortic aneurysm of unspecified site, without rupture: Secondary | ICD-10-CM

## 2023-06-04 ENCOUNTER — Other Ambulatory Visit: Payer: Self-pay

## 2023-06-04 DIAGNOSIS — F411 Generalized anxiety disorder: Secondary | ICD-10-CM

## 2023-06-04 MED ORDER — CLONAZEPAM 1 MG PO TABS: 2.0000 mg | ORAL_TABLET | Freq: Two times a day (BID) | ORAL | 2 refills | Status: DC | PRN

## 2023-06-15 ENCOUNTER — Other Ambulatory Visit: Payer: Self-pay

## 2023-06-15 DIAGNOSIS — N529 Male erectile dysfunction, unspecified: Secondary | ICD-10-CM

## 2023-06-15 MED ORDER — SILDENAFIL CITRATE 25 MG PO TABS: 25.0000 mg | ORAL_TABLET | Freq: Every day | ORAL | 5 refills | Status: DC | PRN

## 2023-08-26 ENCOUNTER — Ambulatory Visit: Payer: Self-pay

## 2023-08-26 ENCOUNTER — Other Ambulatory Visit: Payer: Self-pay

## 2023-08-26 DIAGNOSIS — Z0000 Encounter for general adult medical examination without abnormal findings: Secondary | ICD-10-CM

## 2023-08-26 DIAGNOSIS — E785 Hyperlipidemia, unspecified: Secondary | ICD-10-CM

## 2023-08-26 LAB — POCT URINALYSIS DIPSTICK
Bilirubin, UA: POSITIVE
Blood, UA: NEGATIVE
Glucose, UA: NEGATIVE
Nitrite, UA: NEGATIVE
Protein, UA: POSITIVE — AB
Spec Grav, UA: >=1.03 — AB (ref 1.010–1.025)
Urobilinogen, UA: 0.2 U/dL
pH, UA: 6 (ref 5.0–8.0)

## 2023-08-26 MED ORDER — ATORVASTATIN CALCIUM 20 MG PO TABS: 20.0000 mg | ORAL_TABLET | Freq: Every day | ORAL | 3 refills | Status: AC

## 2023-08-27 LAB — CMP12+LP+TP+TSH+6AC+PSA+CBC…
ALT: 16 IU/L (ref 0–44)
AST: 18 IU/L (ref 0–40)
Albumin: 4.2 g/dL (ref 3.9–4.9)
Alkaline Phosphatase: 100 IU/L (ref 44–121)
BUN/Creatinine Ratio: 12 (ref 10–24)
BUN: 12 mg/dL (ref 8–27)
Basophils Absolute: 0.1 10*3/uL (ref 0.0–0.2)
Basos: 1 %
Bilirubin Total: 0.6 mg/dL (ref 0.0–1.2)
Calcium: 9.2 mg/dL (ref 8.6–10.2)
Chloride: 97 mmol/L (ref 96–106)
Chol/HDL Ratio: 2.9 ratio (ref 0.0–5.0)
Cholesterol, Total: 100 mg/dL (ref 100–199)
Creatinine, Ser: 1 mg/dL (ref 0.76–1.27)
EOS (ABSOLUTE): 0.1 10*3/uL (ref 0.0–0.4)
Eos: 2 %
Estimated CHD Risk: <0.5 times avg. (ref 0.0–1.0)
Free Thyroxine Index: 2.9 (ref 1.2–4.9)
GGT: 16 IU/L (ref 0–65)
Globulin, Total: 2.5 g/dL (ref 1.5–4.5)
Glucose: 92 mg/dL (ref 70–99)
HDL: 35 mg/dL — ABNORMAL LOW (ref >39)
Hematocrit: 46.3 % (ref 37.5–51.0)
Hemoglobin: 15 g/dL (ref 13.0–17.7)
Immature Grans (Abs): 0 10*3/uL (ref 0.0–0.1)
Immature Granulocytes: 0 %
Iron: 87 ug/dL (ref 38–169)
LDH: 168 IU/L (ref 121–224)
LDL Chol Calc (NIH): 46 mg/dL (ref 0–99)
Lymphocytes Absolute: 2 10*3/uL (ref 0.7–3.1)
Lymphs: 25 %
MCH: 29 pg (ref 26.6–33.0)
MCHC: 32.4 g/dL (ref 31.5–35.7)
MCV: 89 fL (ref 79–97)
Monocytes Absolute: 0.5 10*3/uL (ref 0.1–0.9)
Monocytes: 6 %
Neutrophils Absolute: 5.3 10*3/uL (ref 1.4–7.0)
Neutrophils: 66 %
Phosphorus: 3.6 mg/dL (ref 2.8–4.1)
Platelets: 225 10*3/uL (ref 150–450)
Potassium: 5.2 mmol/L (ref 3.5–5.2)
Prostate Specific Ag, Serum: 2.8 ng/mL (ref 0.0–4.0)
RBC: 5.18 x10E6/uL (ref 4.14–5.80)
RDW: 13 % (ref 11.6–15.4)
Sodium: 138 mmol/L (ref 134–144)
T3 Uptake Ratio: 30 % (ref 24–39)
T4, Total: 9.8 ug/dL (ref 4.5–12.0)
TSH: 1.32 u[IU]/mL (ref 0.450–4.500)
Total Protein: 6.7 g/dL (ref 6.0–8.5)
Triglycerides: 97 mg/dL (ref 0–149)
Uric Acid: 5.6 mg/dL (ref 3.8–8.4)
VLDL Cholesterol Cal: 19 mg/dL (ref 5–40)
WBC: 8 10*3/uL (ref 3.4–10.8)
eGFR: 84 mL/min/{1.73_m2} (ref >59)

## 2023-09-03 ENCOUNTER — Other Ambulatory Visit: Payer: Self-pay

## 2023-09-03 DIAGNOSIS — F411 Generalized anxiety disorder: Secondary | ICD-10-CM

## 2023-09-03 MED ORDER — CLONAZEPAM 1 MG PO TABS: 2.0000 mg | ORAL_TABLET | Freq: Two times a day (BID) | ORAL | 2 refills | Status: DC | PRN

## 2023-09-08 ENCOUNTER — Ambulatory Visit: Payer: Self-pay | Admitting: Physician Assistant

## 2023-09-08 ENCOUNTER — Other Ambulatory Visit: Payer: Self-pay | Admitting: Physician Assistant

## 2023-09-08 ENCOUNTER — Encounter: Payer: Self-pay | Admitting: Physician Assistant

## 2023-09-08 VITALS — BP 135/66 | HR 55 | Temp 97.6°F | Resp 14 | Ht 69.0 in | Wt 145.0 lb

## 2023-09-08 DIAGNOSIS — R059 Cough, unspecified: Secondary | ICD-10-CM

## 2023-09-08 DIAGNOSIS — J302 Other seasonal allergic rhinitis: Secondary | ICD-10-CM

## 2023-09-08 DIAGNOSIS — Z0000 Encounter for general adult medical examination without abnormal findings: Secondary | ICD-10-CM

## 2023-09-08 DIAGNOSIS — J22 Unspecified acute lower respiratory infection: Secondary | ICD-10-CM

## 2023-09-08 DIAGNOSIS — R350 Frequency of micturition: Secondary | ICD-10-CM

## 2023-09-08 MED ORDER — ALBUTEROL SULFATE HFA 108 (90 BASE) MCG/ACT IN AERS: 1.0000 | INHALATION_SPRAY | RESPIRATORY_TRACT | 3 refills | Status: AC | PRN

## 2023-09-08 MED ORDER — LORATADINE 10 MG PO TABS: 10.0000 mg | ORAL_TABLET | Freq: Every day | ORAL | 3 refills | Status: AC

## 2023-09-08 NOTE — Progress Notes (Signed)
 [place] [facility]   ____________________________________________   None    (approximate)  I have reviewed the triage vital signs and the nursing notes.   HISTORY  Chief Complaint No chief complaint on file.    HPI Paul Koch is a 63 y.o. male presents for annual physical exam.  Patient complaining of urinary urgency and frequency.  Stated condition worse at night causing 3-4 episodes.  Denies dysuria or hematuria.  Slight increase of PSA from last exam.  Request refill of albuterol  and Claritin .        Past Medical History:  Diagnosis Date   AAA (abdominal aortic aneurysm) (HCC)    Anxiety disorder    CAD (coronary artery disease)    COPD (chronic obstructive pulmonary disease) (HCC)    Eczema    ED (erectile dysfunction)    Elevated lipids    Hyperplastic colon polyp 2014   due 2024   Hypertension    Lung nodule     Patient Active Problem List   Diagnosis Date Noted   Non-functional thyroid  nodule 11/17/2018   Sinus bradycardia 02/09/2018   Pain in both lower extremities 02/10/2017   Coronary artery disease 05/14/2016   Hyperlipidemia LDL goal <70 05/14/2016   Arteriosclerosis of abdominal aorta (HCC) 05/14/2016   Solitary lung nodule 05/14/2016   ED (erectile dysfunction) 03/18/2016   Abnormal stress test    Insomnia 01/09/2009   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 08/06/2008   ECZEMA 08/06/2008   DERMATOPHYTOSIS OF NAIL 07/14/2007   INGROWING NAIL 07/14/2007   Anxiety state 06/02/2007   Essential hypertension 06/02/2007    Past Surgical History:  Procedure Laterality Date   CARDIAC CATHETERIZATION N/A 01/30/2016   Procedure: Left Heart Cath and Coronary Angiography;  Surgeon: Lucendia Rusk, MD;  Location: [facility] INVASIVE CV LAB;  Service: Cardiovascular;  Laterality: N/A;   CARDIAC CATHETERIZATION N/A 01/30/2016   Procedure: Coronary Stent Intervention;  Surgeon: Lucendia Rusk, MD;  Location: [facility]  INVASIVE CV LAB;  Service: Cardiovascular;  Laterality: N/A;   TONSILLECTOMY AND ADENOIDECTOMY     VASECTOMY      Prior to Admission medications   Medication Sig Start Date End Date Taking? Authorizing Provider  albuterol  (VENTOLIN  HFA) 108 (90 Base) MCG/ACT inhaler INHALE 1-2 PUFFS INTO THE LUNGS EVERY 6 (SIX) HOURS AS NEEDED FOR SHORTNESS OF BREATH. Patient taking differently: Inhale 1-2 puffs into the lungs as needed for shortness of breath. 10/02/20   Scarboro, Adam J, NP  ALPRAZolam  (XANAX ) 0.5 MG tablet Take 1 tablet (0.5 mg total) by mouth 2 (two) times daily as needed for anxiety. Patient not taking: Reported on 04/21/2023 07/28/22   Marcina Severe, PA-C  amLODipine  (NORVASC ) 10 MG tablet Take 1 tablet (10 mg total) by mouth daily. 04/21/23   Furth, Cadence H, PA-C  ASPIRIN  LOW DOSE 81 MG EC tablet TAKE 1 TABLET BY MOUTH EVERY DAY 03/20/20   Marcina Severe, PA-C  atorvastatin  (LIPITOR) 20 MG tablet Take 1 tablet (20 mg total) by mouth daily. 08/26/23   Marcina Severe, PA-C  clonazePAM  (KLONOPIN ) 1 MG tablet Take 2 tablets (2 mg total) by mouth 2 (two) times daily as needed for anxiety. 09/03/23   Marcina Severe, PA-C  FLUoxetine  (PROZAC ) 20 MG tablet Take 3 tablets (60 mg total) by mouth daily. 03/29/23   Marcina Severe, PA-C  loratadine  (CLARITIN ) 10 MG tablet TAKE 1 TABLET BY MOUTH EVERY DAY AS NEEDED FOR ALLERGY 08/18/22  Marcina Severe, PA-C  metoprolol  tartrate (LOPRESSOR ) 25 MG tablet Take 1 tablet (25 mg total) by mouth 2 (two) times daily. 01/04/23   Marcina Severe, PA-C  ramipril  (ALTACE ) 10 MG capsule Take 1 capsule (10 mg total) by mouth 2 (two) times daily. 01/04/23   Marcina Severe, PA-C  sildenafil  (VIAGRA ) 25 MG tablet Take 1 tablet (25 mg total) by mouth daily as needed for erectile dysfunction. 06/15/23   Marcina Severe, PA-C  zolpidem  (AMBIEN ) 5 MG tablet Take 1 tablet (5 mg total) by mouth at bedtime as needed for sleep. 09/03/22   Marcina Severe, PA-C     Allergies Clindamycin hcl  Family History  Problem Relation Age of Onset   Brain cancer Mother    Lung cancer Mother    Arrhythmia Mother    Hypertension Mother    Hyperlipidemia Mother    Bladder Cancer Mother    Stroke Mother    Emphysema Father     Social History Social History   Tobacco Use   Smoking status: Former    Current packs/day: 0.00    Average packs/day: 1 pack/day for 20.0 years (20.0 ttl pk-yrs)    Types: Cigarettes    Start date: 67    Quit date: 2004    Years since quitting: 21.4   Smokeless tobacco: Never  Vaping Use   Vaping status: Never Used  Substance Use Topics   Alcohol use: No    Comment: social (5 drinks/year)   Drug use: No    Review of Systems Constitutional: No fever/chills Eyes: No visual changes. ENT: No sore throat. Cardiovascular: Denies chest pain. Respiratory: Denies shortness of breath. Gastrointestinal: No abdominal pain.  No nausea, no vomiting.  No diarrhea.  No constipation. Genitourinary: Negative for dysuria. Musculoskeletal: Negative for back pain. Skin: Negative for rash. Neurological: Negative for headaches, focal weakness or numbness. Psychiatric: Anxiety Endocrine: Hyperlipidemia and hypertension Allergic/Immunilogical: Clindamycin ____________________________________________   PHYSICAL EXAM:  VITAL SIGNS: BP 135/66  Cuff Size Normal  Pulse Rate 55Pulse Rate. 55. Data is abnormal. Taken on 09/08/23 8:37 AM  Temp 97.6 F (36.4 C)  Weight 145 lb (65.8 kg)  Height 5\' 9"  (1.753 m)  Resp 14  SpO2 94 %   BMI: 21.41 kg/m2  BSA: 1.79 m2   Constitutional: Alert and oriented. Well appearing and in no acute distress. Eyes: Conjunctivae are normal. PERRL. EOMI. Head: Atraumatic. Nose: No congestion/rhinnorhea. Mouth/Throat: Mucous membranes are moist.  Oropharynx non-erythematous. Neck: No stridor.  No cervical spine tenderness to palpation. Hematological/Lymphatic/Immunilogical: No cervical  lymphadenopathy. Cardiovascular: Normal rate, regular rhythm. Grossly normal heart sounds.  Good peripheral circulation. Respiratory: Normal respiratory effort.  No retractions. Lungs CTAB. Gastrointestinal: Soft and nontender. No distention. No abdominal bruits. No CVA tenderness. Genitourinary: Deferred Musculoskeletal: No lower extremity tenderness nor edema.  No joint effusions. Neurologic:  Normal speech and language. No gross focal neurologic deficits are appreciated. No gait instability. Skin:  Skin is warm, dry and intact. No rash noted. Psychiatric: Mood and affect are normal. Speech and behavior are normal.  ____________________________________________   LABS        Component Ref Range & Units (hover) 13 d ago 1 yr ago 2 yr ago 4 yr ago  Color, UA Dark Amber amber yellow Dark Yellow  Clarity, UA Clear clear clear Clear  Glucose, UA Negative Negative Negative Negative  Bilirubin, UA Positive neg negative Negative  Comment: 1+  Ketones, UA Positve neg negative Negative  Comment: 1+  Spec Grav, UA >=1.030 Abnormal  1.010 1.020 1.025  Blood, UA Negative neg negative Negative  pH, UA 6.0 6.0 6.0 6.0  Protein, UA Positive Abnormal  Negative Negative Positive Abnormal  CM  Comment: 1+  Urobilinogen, UA 0.2 0.2 0.2 0.2  Nitrite, UA Negative neg negative Negative  Leukocytes, UA Small (1+) Abnormal  Negative Negative Negative  Comment: 1+  Appearance  dark    Odor  strong                 Component Ref Range & Units (hover) 13 d ago (08/26/23) 1 yr ago (08/26/22) 2 yr ago (09/05/21) 3 yr ago (05/28/20) 4 yr ago (05/05/19) 7 yr ago (02/05/16) 7 yr ago (02/05/16)  Glucose 92 86 102 High  96 R 88 R 78 R   Uric Acid 5.6 4.9 CM 6.0 CM 4.7 CM 5.0 CM    Comment:            Therapeutic target for gout patients: <6.0  BUN 12 8 15 11 13 17  R   Creatinine, Ser 1.00 1.07 1.00 0.95 CM 0.93 0.94   eGFR 84 77 85      BUN/Creatinine Ratio 12 7 Low  15 12 14 18  R   Sodium 138 136 138  138 139 135   Potassium 5.2 5.0 5.0 4.8 5.0 4.9   Chloride 97 95 Low  97 100 100 95 Low    Calcium  9.2 9.2 9.1 8.6 8.8 8.9 R   Phosphorus 3.6 3.6 3.3 3.3 3.9    Total Protein 6.7 7.1 6.8 6.8 6.6 6.7   Albumin 4.2 4.3 4.1 R 4.2 R 4.3 R 4.1 R   Globulin, Total 2.5 2.8 2.7 2.6 2.3 2.6   Bilirubin Total 0.6 0.6 0.6 0.4 0.4 0.3   Alkaline Phosphatase 100 82 87 76 75 R 61 R   LDH 168 177 153 156 164    AST 18 19 18 16 20 17    ALT 16 15 15 16 20 19    GGT 16 11 13 20 15     Iron 87 65 55 74 76    Cholesterol, Total 100 117 111 115 111  162  Triglycerides 97 81 116 96 89  287 High   HDL 35 Low  42 33 Low  33 Low  34 Low   29 Low   VLDL Cholesterol Cal 19 16 21 18 17   57 High   LDL Chol Calc (NIH) 46 59 57 64 60    Chol/HDL Ratio 2.9 2.8 CM 3.4 CM 3.5 CM 3.3 CM  5.6 High  R, CM  Comment:                                   T. Chol/HDL Ratio                                             Men  Women                               1/2 Avg.Risk  3.4    3.3  Avg.Risk  5.0    4.4                                2X Avg.Risk  9.6    7.1                                3X Avg.Risk 23.4   11.0  Estimated CHD Risk  < 0.5  < 0.5 CM 0.5 CM 0.6 CM  < 0.5 CM    Comment: The CHD Risk is based on the T. Chol/HDL ratio. Other factors affect CHD Risk such as hypertension, smoking, diabetes, severe obesity, and family history of premature CHD.  TSH 1.320 1.760 0.585 0.765 0.795    T4, Total 9.8 9.3 9.7 9.0 7.9    T3 Uptake Ratio 30 26 30 29 28     Free Thyroxine Index 2.9 2.4 2.9 2.6 2.2    Prostate Specific Ag, Serum 2.8 1.7 CM 2.5 CM 1.2 CM 1.8 CM    Comment: Roche ECLIA methodology. According to the American Urological Association, Serum PSA should decrease and remain at undetectable levels after radical prostatectomy. The AUA defines biochemical recurrence as an initial PSA value 0.2 ng/mL or greater followed by a subsequent confirmatory PSA value 0.2 ng/mL or greater. Values  obtained with different assay methods or kits cannot be used interchangeably. Results cannot be interpreted as absolute evidence of the presence or absence of malignant disease.  WBC 8.0 9.8 7.9 7.0 7.6    RBC 5.18 5.29 5.13 4.93 5.15    Hemoglobin 15.0 15.4 15.0 14.8 14.7    Hematocrit 46.3 45.0 44.0 43.4 44.8    MCV 89 85 86 88 87    MCH 29.0 29.1 29.2 30.0 28.5    MCHC 32.4 34.2 34.1 34.1 32.8    RDW 13.0 13.0 13.0 13.9 13.5    Platelets 225 248 230 198 238    Neutrophils 66 60 67 61 61    Lymphs 25 30 24 31 31     Monocytes 6 7 7 6 5     Eos 2 2 1 2 2     Basos 1 1 1  0 1    Neutrophils Absolute 5.3 5.9 5.2 4.2 4.6    Lymphocytes Absolute 2.0 2.9 1.9 2.2 2.4    Monocytes Absolute 0.5 0.6 0.5 0.4 0.4    EOS (ABSOLUTE) 0.1 0.2 0.1 0.1 0.1    Basophils Absolute 0.1 0.1 0.1 0.0 0.0    Immature Granulocytes 0 0 0 0 0    Immature Grans (Abs) 0.0 0.0 0.0 0.0 0.0                    ____________________________________________  EKG  Sinus bradycardia 49 bpm ____________________________________________    ____________________________________________   INITIAL IMPRESSION / ASSESSMENT AND PLAN / ED COURSE  As part of my medical decision making, I reviewed the following data within the electronic MEDICAL RECORD NUMBER      No acute findings on physical exam, EKG, labs.  Will consult urology for urinary frequency and urgency.        ____________________________________________   FINAL CLINICAL IMPRESSION     ED Discharge Orders     None        Note:  This document was prepared using Dragon voice recognition software and may include unintentional dictation errors.

## 2023-09-09 NOTE — Addendum Note (Signed)
 Addended by: Walt Gunner on: 09/09/2023 08:28 AM   Modules accepted: Orders

## 2023-09-10 ENCOUNTER — Other Ambulatory Visit: Payer: Self-pay | Admitting: Physician Assistant

## 2023-09-10 DIAGNOSIS — F411 Generalized anxiety disorder: Secondary | ICD-10-CM

## 2023-09-10 MED ORDER — FLUOXETINE HCL 40 MG PO CAPS: 40.0000 mg | ORAL_CAPSULE | Freq: Every day | ORAL | 3 refills | Status: AC

## 2023-10-04 ENCOUNTER — Other Ambulatory Visit: Payer: Self-pay | Admitting: Physician Assistant

## 2023-10-04 DIAGNOSIS — I10 Essential (primary) hypertension: Secondary | ICD-10-CM

## 2023-10-05 ENCOUNTER — Other Ambulatory Visit: Payer: Self-pay

## 2023-10-05 DIAGNOSIS — I10 Essential (primary) hypertension: Secondary | ICD-10-CM

## 2023-10-05 MED ORDER — METOPROLOL TARTRATE 25 MG PO TABS: 25.0000 mg | ORAL_TABLET | Freq: Two times a day (BID) | ORAL | 0 refills | Status: DC

## 2023-10-05 NOTE — Telephone Encounter (Signed)
 Refilled today by Alan Showers, PA-C

## 2023-10-15 ENCOUNTER — Ambulatory Visit (INDEPENDENT_AMBULATORY_CARE_PROVIDER_SITE_OTHER): Admitting: Urology

## 2023-10-15 VITALS — BP 166/92 | HR 61 | Ht 69.0 in | Wt 142.0 lb

## 2023-10-15 DIAGNOSIS — N529 Male erectile dysfunction, unspecified: Secondary | ICD-10-CM

## 2023-10-15 DIAGNOSIS — R350 Frequency of micturition: Secondary | ICD-10-CM

## 2023-10-15 DIAGNOSIS — R351 Nocturia: Secondary | ICD-10-CM | POA: Diagnosis not present

## 2023-10-15 LAB — MICROSCOPIC EXAMINATION

## 2023-10-15 LAB — URINALYSIS, COMPLETE
Bilirubin, UA: NEGATIVE
Glucose, UA: NEGATIVE
Ketones, UA: NEGATIVE
Leukocytes,UA: NEGATIVE
Nitrite, UA: NEGATIVE
Protein,UA: NEGATIVE
RBC, UA: NEGATIVE
Specific Gravity, UA: 1.025 (ref 1.005–1.030)
Urobilinogen, Ur: 0.2 mg/dL (ref 0.2–1.0)
pH, UA: 6 (ref 5.0–7.5)

## 2023-10-15 LAB — BLADDER SCAN AMB NON-IMAGING

## 2023-10-15 MED ORDER — TAMSULOSIN HCL 0.4 MG PO CAPS: 0.4000 mg | ORAL_CAPSULE | Freq: Every day | ORAL | 0 refills | Status: DC

## 2023-10-15 MED ORDER — SILDENAFIL CITRATE 50 MG PO TABS: 50.0000 mg | ORAL_TABLET | Freq: Every day | ORAL | 0 refills | Status: AC | PRN

## 2023-10-15 NOTE — Progress Notes (Signed)
 I, Paul Koch, acting as a scribe for Paul JAYSON Barba, MD., have documented all relevant documentation on the behalf of Paul JAYSON Barba, MD, as directed by Paul JAYSON Barba, MD while in the presence of Paul JAYSON Barba, MD.  10/15/2023 6:58 PM   Marinell BIRCH [place] 03/11/1961 [id number]  Referring provider: Claudene Tanda POUR, PA-C [address] [place],  [place] [address]  Chief Complaint  Patient presents with   New Patient (Initial Visit)    HPI: Paul Koch is a 63 y.o. male referred for evaluation of lower urinary tract symptoms.  2 year history of lower urinary tract symptoms consisting of weak urinary stream, occasional urinary frequency, and nocturia x4. IPSS 9/35.  No previous medical management or prior evaluation.  Does have history of snoring, but no history of sleep apnea or testing.  He also complains of 3 year history of difficulty achieving and maintaining an erection. States his wife does not have desire and he typically masturbates, though does have difficulty due to his ED.  He has used sildenafil  25-50 mg in the past with still some difficulty achieving and maintaining an erection.  Organic risk factors include coronary artery disease, hypertension, hyperlipidemia, tobacco history, and antihypertensive medications. PSA 08/2023 2.8   PMH: Past Medical History:  Diagnosis Date   AAA (abdominal aortic aneurysm) (HCC)    Anxiety disorder    CAD (coronary artery disease)    COPD (chronic obstructive pulmonary disease) (HCC)    Eczema    ED (erectile dysfunction)    Elevated lipids    Hyperplastic colon polyp 2014   due 2024   Hypertension    Lung nodule     Surgical History: Past Surgical History:  Procedure Laterality Date   CARDIAC CATHETERIZATION N/A 01/30/2016   Procedure: Left Heart Cath and Coronary Angiography;  Surgeon: Candyce GORMAN Reek, MD;  Location: [facility] INVASIVE CV LAB;  Service: Cardiovascular;  Laterality: N/A;   CARDIAC CATHETERIZATION  N/A 01/30/2016   Procedure: Coronary Stent Intervention;  Surgeon: Candyce GORMAN Reek, MD;  Location[facility] INVASIVE CV LAB;  Service: Cardiovascular;  Laterality: N/A;   TONSILLECTOMY AND ADENOIDECTOMY     VASECTOMY      Home Medications:  Allergies as of 10/15/2023       Reactions   Clindamycin Hcl    REACTION: stomach upset        Medication List        Accurate as of October 15, 2023  6:58 PM. If you have any questions, ask your nurse or doctor.          albuterol  108 (90 Base) MCG/ACT inhaler Commonly known as: VENTOLIN  HFA Inhale 1-2 puffs into the lungs as needed for shortness of breath.   ALPRAZolam  0.5 MG tablet Commonly known as: XANAX  Take 1 tablet (0.5 mg total) by mouth 2 (two) times daily as needed for anxiety.   amLODipine  10 MG tablet Commonly known as: NORVASC  Take 1 tablet (10 mg total) by mouth daily.   Aspirin  Low Dose 81 MG tablet Generic drug: aspirin  EC TAKE 1 TABLET BY MOUTH EVERY DAY   atorvastatin  20 MG tablet Commonly known as: LIPITOR Take 1 tablet (20 mg total) by mouth daily.   clonazePAM  1 MG tablet Commonly known as: KLONOPIN  Take 2 tablets (2 mg total) by mouth 2 (two) times daily as needed for anxiety.   FLUoxetine  40 MG capsule Commonly known as: PROZAC  Take 1 capsule (40 mg total) by mouth daily.  loratadine  10 MG tablet Commonly known as: CLARITIN  Take 1 tablet (10 mg total) by mouth daily.   metoprolol  tartrate 25 MG tablet Commonly known as: LOPRESSOR  Take 1 tablet (25 mg total) by mouth 2 (two) times daily.   ramipril  10 MG capsule Commonly known as: ALTACE  Take 1 capsule (10 mg total) by mouth 2 (two) times daily.   sildenafil  50 MG tablet Commonly known as: Viagra  Take 1 tablet (50 mg total) by mouth daily as needed for erectile dysfunction. Take 1-2 tablets by mouth 1 hour prior to intercourse What changed:  medication strength how much to take additional instructions   tamsulosin  0.4 MG Caps  capsule Commonly known as: FLOMAX  Take 1 capsule (0.4 mg total) by mouth daily.   zolpidem  5 MG tablet Commonly known as: AMBIEN  Take 1 tablet (5 mg total) by mouth at bedtime as needed for sleep.        Allergies:  Allergies  Allergen Reactions   Clindamycin Hcl     REACTION: stomach upset    Family History: Family History  Problem Relation Age of Onset   Brain cancer Mother    Lung cancer Mother    Arrhythmia Mother    Hypertension Mother    Hyperlipidemia Mother    Bladder Cancer Mother    Stroke Mother    Emphysema Father     Social History:  reports that he quit smoking about 21 years ago. His smoking use included cigarettes. He started smoking about 41 years ago. He has a 20 pack-year smoking history. He has never used smokeless tobacco. He reports that he does not drink alcohol and does not use drugs.   Physical Exam: BP (!) 166/92   Pulse 61   Ht 5' 9 (1.753 m)   Wt 142 lb (64.4 kg)   BMI 20.97 kg/m   Constitutional:  Alert and oriented, No acute distress. HEENT: [place] AT, moist mucus membranes.  Trachea midline, no masses. Cardiovascular: No clubbing, cyanosis, or edema. Respiratory: Normal respiratory effort, no increased work of breathing. GI: Abdomen is soft, nontender, nondistended, no abdominal masses GU: Prostate 45 grams, smooth without nodules.  Skin: No rashes, bruises or suspicious lesions. Neurologic: Grossly intact, no focal deficits, moving all 4 extremities. Psychiatric: Normal mood and affect.  Urinalysis Dipstick/microscopy negative.   Assessment & Plan:    1. Nocturia This is his most bothersome symptom. Although nocturia can be secondary to BPH, we discuss other possible causes including nocturnal polyuria and sleep apnea.  Trial tamsulosin  0.4mg  daily an Rx sent to pharmacy.  He will call back or send a MyChart message in approximately 1 month regarding efficacy.  2. Erectile dysfunction Significant organic risk factors,   Tadalafil 50 mg is partially effective Rx sildenafil  50 mg 1-2 as needed, send to pharmacy.  We discussed second-line options if dose titration not effective, and he is not interested in pursuing intracavernosal injections.  [facility] [address] [place], [place] [address] [phone]

## 2023-10-16 ENCOUNTER — Encounter: Payer: Self-pay | Admitting: Urology

## 2023-11-03 ENCOUNTER — Other Ambulatory Visit: Payer: Self-pay | Admitting: Internal Medicine

## 2023-11-03 ENCOUNTER — Other Ambulatory Visit: Payer: Self-pay | Admitting: Urology

## 2023-11-03 ENCOUNTER — Other Ambulatory Visit: Payer: Self-pay | Admitting: Physician Assistant

## 2023-11-03 DIAGNOSIS — I10 Essential (primary) hypertension: Secondary | ICD-10-CM

## 2023-12-01 ENCOUNTER — Other Ambulatory Visit: Payer: Self-pay

## 2023-12-01 DIAGNOSIS — F411 Generalized anxiety disorder: Secondary | ICD-10-CM

## 2023-12-01 MED ORDER — CLONAZEPAM 1 MG PO TABS: 2.0000 mg | ORAL_TABLET | Freq: Two times a day (BID) | ORAL | 2 refills | Status: DC | PRN

## 2023-12-10 ENCOUNTER — Other Ambulatory Visit: Payer: Self-pay | Admitting: Urology

## 2023-12-29 ENCOUNTER — Other Ambulatory Visit: Payer: Self-pay | Admitting: Internal Medicine

## 2024-02-25 ENCOUNTER — Other Ambulatory Visit: Payer: Self-pay | Admitting: Medical

## 2024-02-26 ENCOUNTER — Other Ambulatory Visit: Payer: Self-pay | Admitting: Physician Assistant

## 2024-02-26 DIAGNOSIS — F411 Generalized anxiety disorder: Secondary | ICD-10-CM

## 2024-03-25 ENCOUNTER — Other Ambulatory Visit: Payer: Self-pay | Admitting: Physician Assistant

## 2024-03-25 DIAGNOSIS — F411 Generalized anxiety disorder: Secondary | ICD-10-CM

## 2024-03-28 ENCOUNTER — Other Ambulatory Visit: Payer: Self-pay

## 2024-03-28 DIAGNOSIS — I10 Essential (primary) hypertension: Secondary | ICD-10-CM

## 2024-03-28 MED ORDER — METOPROLOL TARTRATE 25 MG PO TABS: 25.0000 mg | ORAL_TABLET | Freq: Two times a day (BID) | ORAL | 3 refills | Status: AC

## 2024-05-25 ENCOUNTER — Ambulatory Visit

## 2024-05-31 ENCOUNTER — Ambulatory Visit: Admitting: Medical
